# Patient Record
Sex: Male | Born: 1960 | Race: Black or African American | Hispanic: No | Marital: Single | Smoking: Current every day smoker
Health system: Southern US, Community
[De-identification: ages and names within clinical notes are randomized; demographics above are authoritative.]

## PROBLEM LIST (undated history)

## (undated) DIAGNOSIS — I350 Nonrheumatic aortic (valve) stenosis: Secondary | ICD-10-CM

## (undated) DIAGNOSIS — I513 Intracardiac thrombosis, not elsewhere classified: Secondary | ICD-10-CM

## (undated) DIAGNOSIS — I2699 Other pulmonary embolism without acute cor pulmonale: Secondary | ICD-10-CM

## (undated) DIAGNOSIS — M1990 Unspecified osteoarthritis, unspecified site: Secondary | ICD-10-CM

## (undated) DIAGNOSIS — K922 Gastrointestinal hemorrhage, unspecified: Secondary | ICD-10-CM

## (undated) HISTORY — PX: NO PAST SURGERIES: SHX2092

---

## 2009-02-02 ENCOUNTER — Other Ambulatory Visit: Payer: Self-pay

## 2018-01-27 ENCOUNTER — Other Ambulatory Visit: Payer: Self-pay

## 2018-01-27 ENCOUNTER — Inpatient Hospital Stay
Admission: EM | Admit: 2018-01-27 | Discharge: 2018-01-30 | DRG: 378 | Discharge status: Against Medical Advice | Payer: Medicare Other | Attending: Internal Medicine | Admitting: Internal Medicine

## 2018-01-27 ENCOUNTER — Encounter: Payer: Self-pay | Admitting: Emergency Medicine

## 2018-01-27 DIAGNOSIS — D696 Thrombocytopenia, unspecified: Secondary | ICD-10-CM | POA: Diagnosis present

## 2018-01-27 DIAGNOSIS — K921 Melena: Secondary | ICD-10-CM | POA: Diagnosis not present

## 2018-01-27 DIAGNOSIS — K922 Gastrointestinal hemorrhage, unspecified: Secondary | ICD-10-CM | POA: Diagnosis present

## 2018-01-27 DIAGNOSIS — R7301 Impaired fasting glucose: Secondary | ICD-10-CM | POA: Diagnosis present

## 2018-01-27 DIAGNOSIS — K5521 Angiodysplasia of colon with hemorrhage: Secondary | ICD-10-CM | POA: Diagnosis present

## 2018-01-27 DIAGNOSIS — Z79899 Other long term (current) drug therapy: Secondary | ICD-10-CM

## 2018-01-27 DIAGNOSIS — K621 Rectal polyp: Secondary | ICD-10-CM | POA: Diagnosis present

## 2018-01-27 DIAGNOSIS — K254 Chronic or unspecified gastric ulcer with hemorrhage: Secondary | ICD-10-CM | POA: Diagnosis present

## 2018-01-27 DIAGNOSIS — F17210 Nicotine dependence, cigarettes, uncomplicated: Secondary | ICD-10-CM | POA: Diagnosis present

## 2018-01-27 DIAGNOSIS — I350 Nonrheumatic aortic (valve) stenosis: Secondary | ICD-10-CM | POA: Diagnosis present

## 2018-01-27 DIAGNOSIS — I248 Other forms of acute ischemic heart disease: Secondary | ICD-10-CM | POA: Diagnosis present

## 2018-01-27 DIAGNOSIS — K648 Other hemorrhoids: Secondary | ICD-10-CM | POA: Diagnosis present

## 2018-01-27 DIAGNOSIS — D62 Acute posthemorrhagic anemia: Secondary | ICD-10-CM | POA: Diagnosis present

## 2018-01-27 DIAGNOSIS — K5520 Angiodysplasia of colon without hemorrhage: Secondary | ICD-10-CM | POA: Diagnosis not present

## 2018-01-27 DIAGNOSIS — Z7901 Long term (current) use of anticoagulants: Secondary | ICD-10-CM

## 2018-01-27 DIAGNOSIS — R7989 Other specified abnormal findings of blood chemistry: Secondary | ICD-10-CM | POA: Diagnosis present

## 2018-01-27 DIAGNOSIS — Z86711 Personal history of pulmonary embolism: Secondary | ICD-10-CM

## 2018-01-27 DIAGNOSIS — Z86718 Personal history of other venous thrombosis and embolism: Secondary | ICD-10-CM | POA: Diagnosis not present

## 2018-01-27 DIAGNOSIS — D649 Anemia, unspecified: Secondary | ICD-10-CM

## 2018-01-27 HISTORY — DX: Nonrheumatic aortic (valve) stenosis: I35.0

## 2018-01-27 HISTORY — DX: Gastrointestinal hemorrhage, unspecified: K92.2

## 2018-01-27 HISTORY — DX: Unspecified osteoarthritis, unspecified site: M19.90

## 2018-01-27 HISTORY — DX: Intracardiac thrombosis, not elsewhere classified: I51.3

## 2018-01-27 HISTORY — DX: Other pulmonary embolism without acute cor pulmonale: I26.99

## 2018-01-27 LAB — PROTIME-INR
INR: 1.07
PROTHROMBIN TIME: 13.8 s (ref 11.4–15.2)

## 2018-01-27 LAB — COMPREHENSIVE METABOLIC PANEL
ALBUMIN: 2.4 g/dL — AB (ref 3.5–5.0)
ALT: 14 U/L (ref 0–44)
AST: 29 U/L (ref 15–41)
Alkaline Phosphatase: 24 U/L — ABNORMAL LOW (ref 38–126)
Anion gap: 4 — ABNORMAL LOW (ref 5–15)
BUN: 26 mg/dL — ABNORMAL HIGH (ref 6–20)
CO2: 27 mmol/L (ref 22–32)
Calcium: 7.4 mg/dL — ABNORMAL LOW (ref 8.9–10.3)
Chloride: 103 mmol/L (ref 98–111)
Creatinine, Ser: 1.03 mg/dL (ref 0.61–1.24)
GFR calc Af Amer: >60 mL/min (ref >60)
GFR calc non Af Amer: >60 mL/min (ref >60)
GLUCOSE: 125 mg/dL — AB (ref 70–99)
POTASSIUM: 4.2 mmol/L (ref 3.5–5.1)
SODIUM: 134 mmol/L — AB (ref 135–145)
Total Bilirubin: 0.7 mg/dL (ref 0.3–1.2)
Total Protein: 4.5 g/dL — ABNORMAL LOW (ref 6.5–8.1)

## 2018-01-27 LAB — CBC
HEMATOCRIT: 21.5 % — AB (ref 40.0–52.0)
Hemoglobin: 7.5 g/dL — ABNORMAL LOW (ref 13.0–18.0)
MCH: 32.3 pg (ref 26.0–34.0)
MCHC: 34.8 g/dL (ref 32.0–36.0)
MCV: 92.7 fL (ref 80.0–100.0)
Platelets: 135 10*3/uL — ABNORMAL LOW (ref 150–440)
RBC: 2.32 MIL/uL — ABNORMAL LOW (ref 4.40–5.90)
RDW: 16.4 % — ABNORMAL HIGH (ref 11.5–14.5)
WBC: 10.4 10*3/uL (ref 3.8–10.6)

## 2018-01-27 LAB — HEMOGLOBIN: HEMOGLOBIN: 7.4 g/dL — AB (ref 13.0–18.0)

## 2018-01-27 MED ORDER — PEG 3350-KCL-NA BICARB-NACL 420 G PO SOLR
4000.0000 mL | Freq: Once | ORAL | Status: AC
  Administered 2018-01-27: 20:00:00 4000 mL via ORAL
  Filled 2018-01-27: qty 4000

## 2018-01-27 MED ORDER — OXYCODONE HCL 5 MG PO TABS
5.0000 mg | ORAL_TABLET | Freq: Three times a day (TID) | ORAL | Status: DC | PRN
  Administered 2018-01-28 – 2018-01-29 (×3): 5 mg via ORAL
  Filled 2018-01-27 (×3): qty 1

## 2018-01-27 MED ORDER — OXYCODONE HCL 5 MG PO TABS
10.00 | ORAL_TABLET | ORAL | Status: DC
Start: ? — End: 2018-01-27

## 2018-01-27 MED ORDER — ACETAMINOPHEN 650 MG RE SUPP: 650.0000 mg | Freq: Four times a day (QID) | RECTAL | Status: DC | PRN

## 2018-01-27 MED ORDER — LIDOCAINE HCL 1 % IJ SOLN
0.50 | INTRAMUSCULAR | Status: DC
Start: ? — End: 2018-01-27

## 2018-01-27 MED ORDER — PANTOPRAZOLE SODIUM 40 MG PO TBEC
40.00 | DELAYED_RELEASE_TABLET | ORAL | Status: DC
Start: 2018-01-27 — End: 2018-01-27

## 2018-01-27 MED ORDER — ONDANSETRON HCL 4 MG/2ML IJ SOLN: 4.0000 mg | Freq: Four times a day (QID) | INTRAMUSCULAR | Status: DC | PRN

## 2018-01-27 MED ORDER — ONDANSETRON HCL 4 MG PO TABS: 4.0000 mg | ORAL_TABLET | Freq: Four times a day (QID) | ORAL | Status: DC | PRN

## 2018-01-27 MED ORDER — PANTOPRAZOLE SODIUM 40 MG PO TBEC
40.0000 mg | DELAYED_RELEASE_TABLET | Freq: Two times a day (BID) | ORAL | Status: DC
  Administered 2018-01-27 – 2018-01-30 (×4): 40 mg via ORAL
  Filled 2018-01-27 (×4): qty 1

## 2018-01-27 MED ORDER — PEG-3350/ELECTROLYTES 236 G PO SOLR
4000.00 | ORAL | Status: DC
Start: ? — End: 2018-01-27

## 2018-01-27 MED ORDER — ACETAMINOPHEN 325 MG PO TABS: 650.0000 mg | ORAL_TABLET | Freq: Four times a day (QID) | ORAL | Status: DC | PRN

## 2018-01-27 MED ORDER — SODIUM CHLORIDE 0.9 % IV SOLN
400.0000 mg | Freq: Once | INTRAVENOUS | Status: AC
  Administered 2018-01-27: 400 mg via INTRAVENOUS
  Filled 2018-01-27: qty 20

## 2018-01-27 MED ORDER — ONDANSETRON HCL 4 MG/2ML IJ SOLN
4.00 | INTRAMUSCULAR | Status: DC
Start: ? — End: 2018-01-27

## 2018-01-27 MED ORDER — GENERIC EXTERNAL MEDICATION
6.25 | Status: DC
Start: 2018-01-27 — End: 2018-01-27

## 2018-01-27 NOTE — H&P (Addendum)
[facility] [place] at [facility]   PATIENT NAME: James Shepherd    MR#:  [id number]  DATE OF BIRTH:  06-13-1960  DATE OF ADMISSION:  01/27/2018  PRIMARY CARE PHYSICIAN: James Sato, MD   REQUESTING/REFERRING PHYSICIAN: Dr Sharman Cheek  CHIEF COMPLAINT:   Chief Complaint  Patient presents with  . GI Bleeding    HISTORY OF PRESENT ILLNESS:  James Shepherd  is a 57 y.o. male with a known history of signing out AGAINST MEDICAL ADVICE this morning from [facility].  He states that he was there for quite a bit of time with bleeding which started out as melena.  He received 11 units of blood.  They found a small gastric ulcer on endoscopy.  He took the colonoscopy prep on Wednesday and Thursday night and then they ended up canceling the colonoscopy.  He signed out AGAINST MEDICAL ADVICE.  Patient does feel fatigued.  Has some nausea.  No abdominal pain.  Today he he stopped at [employer] and had a bowel movement which showed bright red bowel movement that he had.  He decided to come at [facility] for further evaluation.  The ER physician spoke with Dr. Servando Snare gastroenterology who recommended a colonoscopy prep tonight for procedure tomorrow.  PAST MEDICAL HISTORY:   Past Medical History:  Diagnosis Date  . Aortic stenosis   . Arthritis   . GI bleeding   . PE (pulmonary thromboembolism) (HCC)   . Thrombus of atrial appendage     PAST SURGICAL HISTORY:   Past Surgical History:  Procedure Laterality Date  . NO PAST SURGERIES      SOCIAL HISTORY:   Social History   Tobacco Use  . Smoking status: Current Every Day Smoker  . Smokeless tobacco: Never Used  Substance Use Topics  . Alcohol use: Yes    Comment: 2 beers per day    FAMILY HISTORY:   Family History  Problem Relation Age of Onset  . CAD Mother     DRUG ALLERGIES:  No Known Allergies  REVIEW OF SYSTEMS:  CONSTITUTIONAL: No fever, chills or sweats.  Positive for fatigue and  weakness.  EYES: No blurred or double vision.  EARS, NOSE, AND THROAT: No tinnitus or ear pain. No sore throat RESPIRATORY: No cough, shortness of breath, wheezing or hemoptysis.  CARDIOVASCULAR: No chest pain, orthopnea, edema.  GASTROINTESTINAL: Positive for nausea.no vomiting, diarrhea or abdominal pain.  Positive for blood in bowel movements GENITOURINARY: No dysuria, hematuria.  ENDOCRINE: No polyuria, nocturia,  HEMATOLOGY: No anemia, easy bruising or bleeding SKIN: No rash or lesion. MUSCULOSKELETAL: No joint pain or arthritis.   NEUROLOGIC: No tingling, numbness, weakness.  PSYCHIATRY: No anxiety or depression.   MEDICATIONS AT HOME:   Prior to Admission medications   Medication Sig Start Date End Date Taking? Authorizing Provider  pantoprazole (PROTONIX) 40 MG tablet Take 40 mg by mouth 2 (two) times daily.   Yes [provider]  warfarin (COUMADIN) 10 MG tablet Take 10 mg by mouth daily.   Yes [provider]      VITAL SIGNS:  Blood pressure (!) 125/49, pulse 72, temperature 98.7 F (37.1 C), temperature source Oral, resp. rate 11, height 6' (1.829 Shepherd), weight 77.1 kg, SpO2 98 %.  PHYSICAL EXAMINATION:  GENERAL:  57 y.o.-year-old patient lying in the bed with no acute distress.  EYES: Pupils equal, round, reactive to light and accommodation. No scleral icterus. Extraocular muscles intact.  HEENT: Head atraumatic, normocephalic.  Oropharynx and nasopharynx clear.  NECK:  Supple, no jugular venous distention. No thyroid enlargement, no tenderness.  LUNGS: Normal breath sounds bilaterally, no wheezing, rales,rhonchi or crepitation. No use of accessory muscles of respiration.  CARDIOVASCULAR: S1, S2 normal. No murmurs, rubs, or gallops.  ABDOMEN: Soft, nontender, nondistended. Bowel sounds present. No organomegaly or mass.  EXTREMITIES: 2+ pedal edema, no cyanosis, or clubbing.  NEUROLOGIC: Cranial nerves II through XII are intact. Muscle strength 5/5 in all  extremities. Sensation intact. Gait not checked.  PSYCHIATRIC: The patient is alert and oriented x 3.  SKIN: No rash, lesion, or ulcer.   LABORATORY PANEL:   CBC Recent Labs  Lab 01/27/18 1445  WBC 10.4  HGB 7.5*  HCT 21.5*  PLT 135*   ------------------------------------------------------------------------------------------------------------------  Chemistries  Recent Labs  Lab 01/27/18 1445  NA 134*  K 4.2  CL 103  CO2 27  GLUCOSE 125*  BUN 26*  CREATININE 1.03  CALCIUM 7.4*  AST 29  ALT 14  ALKPHOS 24*  BILITOT 0.7   ------------------------------------------------------------------------------------------------------------------     IMPRESSION AND PLAN:   1.  Gastrointestinal bleeding, acute blood loss anemia with recent endoscopy showing a small gastric ulcer that was nonbleeding.  Serial hemoglobins.  Prep for colonoscopy.  Gastrointestinal consultation.  IV iron to be given. 2.  Severe aortic stenosis seen on prior echocardiogram. 3.  Impaired fasting glucose check a hemoglobin A1c 4.  Tobacco abuse.  Smoking cessation counseling done 4 minutes by me.  No need for nicotine patch because he only smokes a few cigarettes per day. 5.  Drinks a couple beers per day.  He will have to stop this. 6.  Thrombocytopenia check hepatitis C 7.  History of DVT and PE, history of thrombus and atrial appendage.  With major GI bleed, Unlikely that we will be able to put him back on the Coumadin right at this point.  All the records are reviewed and case discussed with ED provider. Management plans discussed with the patient, family and they are in agreement.  CODE STATUS: Full code  TOTAL TIME TAKING CARE OF THIS PATIENT: 50 minutes, including ACP time.    Alford Highlandichard Solenne Manwarren Shepherd.D on 01/27/2018 at 5:00 PM  Between 7am to 6pm - Pager - [phone]  After 6pm call admission pager [phone]  [facility] Office  [phone]  CC: Primary care physician; James SatoMiles, Linda  M, MD

## 2018-01-27 NOTE — ED Provider Notes (Signed)
[facility] Emergency Department Provider Note  ____________________________________________  Time seen: Approximately 4:48 PM  I have reviewed the triage vital signs and the nursing notes.   HISTORY  Chief Complaint GI Bleeding    HPI James Shepherd is a 57 y.o. male with a history of pulmonary embolism who comes to the ED complaining of rectal bleeding.  The patient had been on Coumadin in the past due to his PEs.  He started having rectal bleeding a week ago and [facility] to [facility] where he was found in the ED to have an INR of 6 and a hemoglobin of 5.  He was started on blood transfusions.  Coumadin was withheld, a CT bleeding scan was negative in the ED.  [facility] he was transfused a total of 11 units of blood.  An EGD was overall unremarkable, just showing a small nonbleeding ulcer at that was found to be H. pylori negative on biopsy.  The patient was planned for colonoscopy but after initially refusing to have it done concurrently with the EGD, then having to have it later rescheduled due to concerns about profuse bleeding, the patient eventually left [facility] AGAINST MEDICAL ADVICE this morning.  Later in the morning while at the grocery he had a large bloody bowel movement and has been feeling dizzy so he comes to [facility] not to continue his care.  Dizziness is worse standing better lying down.  No other aggravating or alleviating factors.  Intermittent.  Moderate severity.      Past Medical History:  Diagnosis Date  . Arthritis   . GI bleeding   . PE (pulmonary thromboembolism) (HCC)   . Thrombus of atrial appendage      Patient Active Problem List   Diagnosis Date Noted  . GI bleed 01/27/2018     History reviewed. No pertinent surgical history.   Prior to Admission medications   Medication Sig Start Date End Date Taking? Authorizing Provider  pantoprazole (PROTONIX) 40 MG tablet Take 40 mg by mouth 2 (two) times daily.   Yes [provider]  warfarin (COUMADIN) 10 MG tablet Take 10 mg by mouth daily.   Yes [provider]     Allergies Patient has no known allergies.   History reviewed. No pertinent family history.  Social History Social History   Tobacco Use  . Smoking status: Current Every Day Smoker  . Smokeless tobacco: Never Used  Substance Use Topics  . Alcohol use: Yes    Comment: 2 beers per day  . Drug use: Never    Review of Systems  Constitutional:   No fever or chills.  ENT:   No sore throat. No rhinorrhea. Cardiovascular:   No chest pain or syncope. Respiratory:   No dyspnea or cough. Gastrointestinal:   Negative for abdominal pain, vomiting and diarrhea.  Positive rectal bleeding Musculoskeletal:   Negative for focal pain or swelling All other systems reviewed and are negative except as documented above in ROS and HPI.  ____________________________________________   PHYSICAL EXAM:  VITAL SIGNS: ED Triage Vitals [01/27/18 1435]  Enc Vitals Group     BP 109/61     Pulse Rate (!) 115     Resp 18     Temp 98.7 F (37.1 C)     Temp Source Oral     SpO2 100 %     Weight 170 lb (77.1 kg)     Height 6' (1.829 m)     Head Circumference  Peak Flow      Pain Score 0     Pain Loc      Pain Edu?      Excl. in GC?     Vital signs reviewed, nursing assessments reviewed.   Constitutional:   Alert and oriented. Non-toxic appearance. Eyes:   Conjunctivae are normal. EOMI. PERRL. ENT      Head:   Normocephalic and atraumatic.      Nose:   No congestion/rhinnorhea.       Mouth/Throat:   MMM, no pharyngeal erythema. No peritonsillar mass.       Neck:   No meningismus. Full ROM. Hematological/Lymphatic/Immunilogical:   No cervical lymphadenopathy. Cardiovascular:   RRR heart rate 80. Symmetric bilateral radial and DP pulses.  No murmurs. Cap refill less than 2 seconds. Respiratory:   Normal respiratory effort without tachypnea/retractions. Breath sounds are clear  and equal bilaterally. No wheezes/rales/rhonchi. Gastrointestinal:   Soft and nontender. Non distended. There is no CVA tenderness.  No rebound, rigidity, or guarding.  Rectal exam shows maroon stool, strongly Hemoccult positive  Musculoskeletal:   Normal range of motion in all extremities. No joint effusions.  No lower extremity tenderness.  No edema. Neurologic:   Normal speech and language.  Motor grossly intact. No acute focal neurologic deficits are appreciated.  Skin:    Skin is warm, dry and intact. No rash noted.  No petechiae, purpura, or bullae.  ____________________________________________    LABS (pertinent positives/negatives) (all labs ordered are listed, but only abnormal results are displayed) Labs Reviewed  COMPREHENSIVE METABOLIC PANEL - Abnormal; Notable for the following components:      Result Value   Sodium 134 (*)    Glucose, Bld 125 (*)    BUN 26 (*)    Calcium 7.4 (*)    Total Protein 4.5 (*)    Albumin 2.4 (*)    Alkaline Phosphatase 24 (*)    Anion gap 4 (*)    All other components within normal limits  CBC - Abnormal; Notable for the following components:   RBC 2.32 (*)    Hemoglobin 7.5 (*)    HCT 21.5 (*)    RDW 16.4 (*)    Platelets 135 (*)    All other components within normal limits  PROTIME-INR  POC OCCULT BLOOD, ED  TYPE AND SCREEN   ____________________________________________   EKG    ____________________________________________    RADIOLOGY  No results found.  ____________________________________________   PROCEDURES .Critical Care Performed by: Sharman Cheek, MD Authorized by: Sharman Cheek, MD   Critical care provider statement:    Critical care time (minutes):  35   Critical care time was exclusive of:  Separately billable procedures and treating other patients   Critical care was necessary to treat or prevent imminent or life-threatening deterioration of the following conditions:  Circulatory failure and  shock   Critical care was time spent personally by me on the following activities:  Development of treatment plan with patient or surrogate, discussions with consultants, evaluation of patient's response to treatment, examination of patient, obtaining history from patient or surrogate, ordering and performing treatments and interventions, ordering and review of laboratory studies, ordering and review of radiographic studies, pulse oximetry, re-evaluation of patient's condition and review of old charts    ____________________________________________    CLINICAL IMPRESSION / ASSESSMENT AND PLAN / ED COURSE  Pertinent labs & imaging results that were available during my care of the patient were reviewed by me and considered in my  medical decision making (see chart for details).    Patient presents with acute lower GI bleed and acute blood loss anemia.  Currently hemoglobin is 7.5 which appears to be stable compared to the 6.3 that was obtained this morning at [facility] just prior to receiving 1 unit of blood before he left that hospital AGAINST MEDICAL ADVICE.  Normotensive, he is orthostatic but not tachycardic at rest in the treatment bed.  INR has normalized.  At this time will defer further transfusions unless he has another bloody bowel movement or deteriorating vital signs.  Case discussed with Dr. Servando Snare who recommends GI prep today for colonoscopy tomorrow by Dr. Tobi Bastos.  Discussed with hospitalist for further management.      ____________________________________________   FINAL CLINICAL IMPRESSION(S) / ED DIAGNOSES    Final diagnoses:  Acute lower GI bleeding  Acute anemia     ED Discharge Orders    None      Portions of this note were generated with dragon dictation software. Dictation errors may occur despite best attempts at proofreading.    Sharman Cheek, MD 01/27/18 [phone]

## 2018-01-27 NOTE — ED Triage Notes (Signed)
Dark red blood from rectum X 1 week. Left [facility] because he was prepped X 2 for coloscopy and they did not get to him.  Sent [facility] for same sx.  No LOC.  Pt reports he had an endoscopy and just needs the colonoscopy and that he is already cleaned out if they can do it today.  Alert and oriented. ST noted in triage but VSS. Pt is on coumadin; has not taken since last Friday.

## 2018-01-27 NOTE — Progress Notes (Signed)
Patient ID: James RicksGary Shepherd, male   DOB: 03/26/1961, 57 y.o.   MRN: [id number]  ACP note Patient present  Diagnosis: Acute GI bleed, acute blood loss anemia, severe aortic stenosis, history of PE and blood clot and atrial appendage, tobacco abuse  CODE STATUS discussed.  Patient is a full code.  Plan.  GI bleed.  Oral Protonix.  Serial hemoglobins.  GI consultation for colonoscopy.  May end up needing capsule endoscopy at some point. IV iron ordered.  Time spent on ACP discussion 17 minutes Dr. Alford Highlandichard Keyandra Swenson

## 2018-01-27 NOTE — ED Notes (Signed)
Wieting at bedside. 

## 2018-01-27 NOTE — ED Notes (Addendum)
Stafford MD at bedside - POC stool occult performed. Positive per MD

## 2018-01-27 NOTE — ED Triage Notes (Signed)
First Nurse Note:  Arrives from [facility] for evaluation of GI bleed.  Patient was [facility] and was scheduled for colonoscopy today, but patient signed out AMA prior to procedure.  Presented at [facility] today due to continued GI bleeding.  PT on Friday was 6.1.  Patient is AAOx3.  Skin warm and dry. NAD

## 2018-01-28 ENCOUNTER — Inpatient Hospital Stay: Payer: Medicare Other | Admitting: Anesthesiology

## 2018-01-28 ENCOUNTER — Encounter: Payer: Self-pay | Admitting: Anesthesiology

## 2018-01-28 ENCOUNTER — Encounter
Admission: EM | Discharge status: Against Medical Advice | Payer: Self-pay | Source: Home / Self Care | Attending: Internal Medicine

## 2018-01-28 DIAGNOSIS — K621 Rectal polyp: Secondary | ICD-10-CM

## 2018-01-28 DIAGNOSIS — K921 Melena: Secondary | ICD-10-CM

## 2018-01-28 DIAGNOSIS — K922 Gastrointestinal hemorrhage, unspecified: Secondary | ICD-10-CM

## 2018-01-28 DIAGNOSIS — K5520 Angiodysplasia of colon without hemorrhage: Secondary | ICD-10-CM

## 2018-01-28 HISTORY — PX: COLONOSCOPY WITH PROPOFOL: SHX5780

## 2018-01-28 LAB — CBC
HCT: 16.7 % — ABNORMAL LOW (ref 40.0–52.0)
Hemoglobin: 5.8 g/dL — ABNORMAL LOW (ref 13.0–18.0)
MCH: 32.8 pg (ref 26.0–34.0)
MCHC: 35 g/dL (ref 32.0–36.0)
MCV: 93.6 fL (ref 80.0–100.0)
PLATELETS: 107 10*3/uL — AB (ref 150–440)
RBC: 1.78 MIL/uL — ABNORMAL LOW (ref 4.40–5.90)
RDW: 18.8 % — AB (ref 11.5–14.5)
WBC: 8.9 10*3/uL (ref 3.8–10.6)

## 2018-01-28 LAB — HEMOGLOBIN AND HEMATOCRIT, BLOOD
HCT: 24 % — ABNORMAL LOW (ref 40.0–52.0)
HEMATOCRIT: 22.8 % — AB (ref 40.0–52.0)
Hemoglobin: 8.3 g/dL — ABNORMAL LOW (ref 13.0–18.0)
Hemoglobin: 7.9 g/dL — ABNORMAL LOW (ref 13.0–18.0)

## 2018-01-28 LAB — BASIC METABOLIC PANEL
Anion gap: 4 — ABNORMAL LOW (ref 5–15)
BUN: 20 mg/dL (ref 6–20)
CALCIUM: 7.1 mg/dL — AB (ref 8.9–10.3)
CO2: 28 mmol/L (ref 22–32)
Chloride: 105 mmol/L (ref 98–111)
Creatinine, Ser: 1 mg/dL (ref 0.61–1.24)
GFR calc Af Amer: >60 mL/min (ref >60)
GLUCOSE: 101 mg/dL — AB (ref 70–99)
Potassium: 4.3 mmol/L (ref 3.5–5.1)
Sodium: 137 mmol/L (ref 135–145)

## 2018-01-28 LAB — IRON AND TIBC
Iron: 158 ug/dL (ref 45–182)
Saturation Ratios: 61 % — ABNORMAL HIGH (ref 17.9–39.5)
TIBC: 257 ug/dL (ref 250–450)
UIBC: 99 ug/dL

## 2018-01-28 LAB — PREPARE RBC (CROSSMATCH)

## 2018-01-28 LAB — TROPONIN I
TROPONIN I: 0.06 ng/mL — AB (ref <0.03)
TROPONIN I: 0.06 ng/mL — AB (ref <0.03)
Troponin I: 0.06 ng/mL (ref <0.03)

## 2018-01-28 LAB — LACTATE DEHYDROGENASE: LDH: 110 U/L (ref 98–192)

## 2018-01-28 LAB — HEMOGLOBIN A1C
HEMOGLOBIN A1C: 5.1 % (ref 4.8–5.6)
MEAN PLASMA GLUCOSE: 99.67 mg/dL

## 2018-01-28 LAB — TRANSFERRIN: TRANSFERRIN: 177 mg/dL — AB (ref 180–329)

## 2018-01-28 LAB — ABO/RH: ABO/RH(D): AB NEG

## 2018-01-28 LAB — VITAMIN B12: VITAMIN B 12: 387 pg/mL (ref 180–914)

## 2018-01-28 LAB — FOLATE: Folate: 6.2 ng/mL (ref >5.9)

## 2018-01-28 SURGERY — COLONOSCOPY WITH PROPOFOL
Anesthesia: General

## 2018-01-28 MED ORDER — PROPOFOL 500 MG/50ML IV EMUL
INTRAVENOUS | Status: AC
  Filled 2018-01-28: qty 50

## 2018-01-28 MED ORDER — LIDOCAINE HCL (PF) 2 % IJ SOLN
INTRAMUSCULAR | Status: AC
  Filled 2018-01-28: qty 10

## 2018-01-28 MED ORDER — SODIUM CHLORIDE 0.9% IV SOLUTION
Freq: Once | INTRAVENOUS | Status: AC
  Administered 2018-01-28: 08:00:00 via INTRAVENOUS

## 2018-01-28 MED ORDER — PROPOFOL 10 MG/ML IV BOLUS
INTRAVENOUS | Status: AC
  Filled 2018-01-28: qty 20

## 2018-01-28 MED ORDER — EPHEDRINE SULFATE 50 MG/ML IJ SOLN
INTRAMUSCULAR | Status: DC | PRN
  Administered 2018-01-28: 5 mg via INTRAVENOUS
  Administered 2018-01-28: 15 mg via INTRAVENOUS
  Administered 2018-01-28: 10 mg via INTRAVENOUS
  Administered 2018-01-28: 5 mg via INTRAVENOUS

## 2018-01-28 MED ORDER — PHENYLEPHRINE HCL 10 MG/ML IJ SOLN
INTRAMUSCULAR | Status: DC | PRN
  Administered 2018-01-28: 100 ug via INTRAVENOUS
  Administered 2018-01-28: 200 ug via INTRAVENOUS
  Administered 2018-01-28: 100 ug via INTRAVENOUS
  Administered 2018-01-28: 200 ug via INTRAVENOUS
  Administered 2018-01-28: 100 ug via INTRAVENOUS
  Administered 2018-01-28: 150 ug via INTRAVENOUS
  Administered 2018-01-28: 200 ug via INTRAVENOUS

## 2018-01-28 MED ORDER — SODIUM CHLORIDE 0.9% IV SOLUTION: Freq: Once | INTRAVENOUS | Status: AC

## 2018-01-28 MED ORDER — PROPOFOL 10 MG/ML IV BOLUS
INTRAVENOUS | Status: DC | PRN
  Administered 2018-01-28: 70 mg via INTRAVENOUS

## 2018-01-28 MED ORDER — SODIUM CHLORIDE 0.9 % IV SOLN
INTRAVENOUS | Status: DC
  Administered 2018-01-28: 1000 mL via INTRAVENOUS

## 2018-01-28 MED ORDER — LIDOCAINE HCL (CARDIAC) PF 100 MG/5ML IV SOSY
PREFILLED_SYRINGE | INTRAVENOUS | Status: DC | PRN
  Administered 2018-01-28: 50 mg via INTRAVENOUS

## 2018-01-28 MED ORDER — PROPOFOL 500 MG/50ML IV EMUL
INTRAVENOUS | Status: DC | PRN
  Administered 2018-01-28: 175 ug/kg/min via INTRAVENOUS

## 2018-01-28 NOTE — Progress Notes (Signed)
Lab notified this nurse of troponin 0.06, notified Dr. Amado CoeGouru. No new orders received.

## 2018-01-28 NOTE — H&P (Signed)
Wyline Mood, MD [address], [address], [place], [place], [address] [address], [address], [place], [place], [address] Phone: [phone]  Fax: [phone]  Primary Care Physician:  Leanna Sato, MD   Pre-Procedure History & Physical: HPI:  James Shepherd is a 57 y.o. male is here for an colonoscopy.   Past Medical History:  Diagnosis Date  . Aortic stenosis   . Arthritis   . GI bleeding   . PE (pulmonary thromboembolism) (HCC)   . Thrombus of atrial appendage     Past Surgical History:  Procedure Laterality Date  . NO PAST SURGERIES      Prior to Admission medications   Medication Sig Start Date End Date Taking? Authorizing Provider  pantoprazole (PROTONIX) 40 MG tablet Take 40 mg by mouth 2 (two) times daily.   Yes [provider]  warfarin (COUMADIN) 10 MG tablet Take 10 mg by mouth daily.   Yes [provider]    Allergies as of 01/27/2018  . (No Known Allergies)    Family History  Problem Relation Age of Onset  . CAD Mother     Social History   Socioeconomic History  . Marital status: Single    Spouse name: Not on file  . Number of children: Not on file  . Years of education: Not on file  . Highest education level: Not on file  Occupational History  . Not on file  Social Needs  . Financial resource strain: Not on file  . Food insecurity:    Worry: Not on file    Inability: Not on file  . Transportation needs:    Medical: Not on file    Non-medical: Not on file  Tobacco Use  . Smoking status: Current Every Day Smoker  . Smokeless tobacco: Never Used  Substance and Sexual Activity  . Alcohol use: Yes    Comment: 2 beers per day  . Drug use: Never  . Sexual activity: Not on file  Lifestyle  . Physical activity:    Days per week: Not on file    Minutes per session: Not on file  . Stress: Not on file  Relationships  . Social connections:    Talks on phone: Not on file    Gets together: Not on file   Attends religious service: Not on file    Active member of club or organization: Not on file    Attends meetings of clubs or organizations: Not on file    Relationship status: Not on file  . Intimate partner violence:    Fear of current or ex partner: Not on file    Emotionally abused: Not on file    Physically abused: Not on file    Forced sexual activity: Not on file  Other Topics Concern  . Not on file  Social History Narrative  . Not on file    Review of Systems: See HPI, otherwise negative ROS  Physical Exam: BP (!) 121/47   Pulse (!) 58   Temp 98.8 F (37.1 C) (Oral)   Resp 16   Ht 6' (1.829 m)   Wt 77.1 kg   SpO2 99%   BMI 23.06 kg/m  General:   Alert,  pleasant and cooperative in NAD Head:  Normocephalic and atraumatic. Neck:  Supple; no masses or thyromegaly. Lungs:  Clear throughout to auscultation, normal respiratory effort.    Heart:  +S1, +S2, Regular rate and rhythm, No edema. Abdomen:  Soft, nontender and nondistended. Normal bowel sounds, without guarding, and without rebound.   Neurologic:  Alert and  oriented x4;  grossly normal neurologically.  Impression/Plan: James Shepherd is here for an colonoscopy to be performed for GI bleeding   Risks, benefits, limitations, and alternatives regarding  colonoscopy have been reviewed with the patient.  Questions have been answered.  All parties agreeable.   Wyline MoodKiran Durwin Davisson, MD  01/28/2018, 2:36 PM

## 2018-01-28 NOTE — Progress Notes (Signed)
Pt has undergone extensive outpatient cardiac workup including cardiac ct angiogram with ct ffr. This revaled  What was felt to show no hemodynamically significant coronary artery disease. Would proceed with gi workup with no further cardiac workup. He appears to be at low risk for egd/colonoscopy. Elevated troponin appears to be demand due to profound anemia.

## 2018-01-28 NOTE — Progress Notes (Signed)
Notified Dr. Tobi BastosAnna of questionable NPO status and when the patient can eat. Dr. Tobi BastosAnna stated the patient can have clear liquids, but no food post 5 hours after capsule study

## 2018-01-28 NOTE — Anesthesia Postprocedure Evaluation (Signed)
Anesthesia Post Note  Patient: Harvel RicksGary Kiser  Procedure(s) Performed: COLONOSCOPY WITH PROPOFOL (N/A )  Patient location during evaluation: Endoscopy Anesthesia Type: General Level of consciousness: awake and alert Pain management: pain level controlled Vital Signs Assessment: post-procedure vital signs reviewed and stable Respiratory status: spontaneous breathing, nonlabored ventilation, respiratory function stable and patient connected to nasal cannula oxygen Cardiovascular status: blood pressure returned to baseline and stable Postop Assessment: no apparent nausea or vomiting Anesthetic complications: no     Last Vitals:  Vitals:   01/28/18 1528 01/28/18 1537  BP: (!) 134/52 (!) 124/52  Pulse: 80 72  Resp: 16 16  Temp:    SpO2: 100% 100%    Last Pain:  Vitals:   01/28/18 1537  TempSrc:   PainSc: 0-No pain                 Cleda MccreedyJoseph K Piscitello

## 2018-01-28 NOTE — Anesthesia Post-op Follow-up Note (Signed)
Anesthesia QCDR form completed.        

## 2018-01-28 NOTE — Op Note (Signed)
[facility] Gastroenterology Patient Name: James Shepherd Procedure Date: 01/28/2018 2:42 PM MRN: [id number] Account #: [id number] Date of Birth: [birth year] Admit Type: Inpatient Age: 57 Room: [facility] ENDO ROOM 4 Gender: Male Note Status: Finalized Procedure:            Colonoscopy Indications:          Hematochezia Providers:            Wyline Mood MD, MD Referring MD:         Leanna Sato, MD (Referring MD) Medicines:            Monitored Anesthesia Care Complications:        No immediate complications. Procedure:            Pre-Anesthesia Assessment:                       - Prior to the procedure, a History and Physical was                        performed, and patient medications, allergies and                        sensitivities were reviewed. The patient's tolerance of                        previous anesthesia was reviewed.                       - The risks and benefits of the procedure and the                        sedation options and risks were discussed with the                        patient. All questions were answered and informed                        consent was obtained.                       - ASA Grade Assessment: III - A patient with severe                        systemic disease.                       After obtaining informed consent, the colonoscope was                        passed under direct vision. Throughout the procedure,                        the patient's blood pressure, pulse, and oxygen                        saturations were monitored continuously. The                        Colonoscope was introduced through the anus and                        advanced to  the the terminal ileum. The colonoscopy was                        performed with ease. The patient tolerated the                        procedure well. The quality of the bowel preparation                        was fair. Findings:      The perianal and digital rectal  examinations were normal.      A 20 mm polyp was found in the rectum. The polyp was pedunculated. The       polyp was removed with a hot snare. Resection and retrieval were       complete. To prevent bleeding after the polypectomy, one hemostatic clip       was successfully placed. There was no bleeding during, or at the end, of       the procedure.      Many sessile, non-bleeding polyps were found in the entire colon. The       polyps were 5 to 8 mm in size. Polypectomy was not attempted due to       presentation of GI bleed and poor prep      Two small localized angioectasias without bleeding were found at the       ileocecal valve. Coagulation for hemostasis using argon plasma at 0.5       liters/minute and 20 watts was successful.      The terminal ileum appeared normal.      Non-bleeding internal hemorrhoids were found during retroflexion. The       hemorrhoids were large. Impression:           - Preparation of the colon was fair.                       - One 20 mm polyp in the rectum, removed with a hot                        snare. Resected and retrieved. Clip was placed.                       - Many 5 to 8 mm, non-bleeding polyps in the entire                        colon. Resection not attempted.                       - Two non-bleeding colonic angioectasias. Treated with                        argon plasma coagulation (APC).                       - The examined portion of the ileum was normal.                       - Non-bleeding internal hemorrhoids. Recommendation:       - Await pathology results.                       -  1. keep NPO for capsule study today                       2. Repeat colonoscopy as an outpatient due to poor prep                        and presence of multiple polyps                       3. If capsule study is negative then will need Meckels                        scan Procedure Code(s):    --- Professional ---                       [phone], 59,  Colonoscopy, flexible; with control of                        bleeding, any method                       45385, Colonoscopy, flexible; with removal of tumor(s),                        polyp(s), or other lesion(s) by snare technique Diagnosis Code(s):    --- Professional ---                       K64.8, Other hemorrhoids                       K55.20, Angiodysplasia of colon without hemorrhage                       K62.1, Rectal polyp                       K92.1, Melena (includes Hematochezia) CPT copyright 2017 American Medical Association. All rights reserved. The codes documented in this report are preliminary and upon coder review may  be revised to meet current compliance requirements. Wyline Mood, MD Wyline Mood MD, MD 01/28/2018 3:16:37 PM This report has been signed electronically. Number of Addenda: 0 Note Initiated On: 01/28/2018 2:42 PM Scope Withdrawal Time: 0 hours 22 minutes 7 seconds  Total Procedure Duration: 0 hours 24 minutes 16 seconds       [facility]

## 2018-01-28 NOTE — Anesthesia Preprocedure Evaluation (Signed)
Anesthesia Evaluation  Patient identified by MRN, date of birth, ID band Patient awake    Reviewed: Allergy & Precautions, H&P , NPO status , Patient's Chart, lab work & pertinent test results  History of Anesthesia Complications Negative for: history of anesthetic complications  Airway Mallampati: III  TM Distance: >3 FB Neck ROM: limited    Dental  (+) Upper Dentures, Lower Dentures, Poor Dentition   Pulmonary shortness of breath and with exertion, COPD, Current Smoker,           Cardiovascular Exercise Tolerance: Poor (-) Past MI + Valvular Problems/Murmurs AS      Neuro/Psych negative neurological ROS  negative psych ROS   GI/Hepatic negative GI ROS, Neg liver ROS, neg GERD  ,  Endo/Other  negative endocrine ROS  Renal/GU negative Renal ROS  negative genitourinary   Musculoskeletal  (+) Arthritis ,   Abdominal   Peds  Hematology negative hematology ROS (+)   Anesthesia Other Findings Concern for lower gi bleed  Past Medical History: No date: Aortic stenosis No date: Arthritis No date: GI bleeding No date: PE (pulmonary thromboembolism) (HCC) No date: Thrombus of atrial appendage  Past Surgical History: No date: NO PAST SURGERIES  BMI    Body Mass Index:  23.06 kg/m      Reproductive/Obstetrics negative OB ROS                             Anesthesia Physical Anesthesia Plan  ASA: IV  Anesthesia Plan: General   Post-op Pain Management:    Induction: Intravenous  PONV Risk Score and Plan: Propofol infusion and TIVA  Airway Management Planned: Natural Airway and Nasal Cannula  Additional Equipment:   Intra-op Plan:   Post-operative Plan:   Informed Consent: I have reviewed the patients History and Physical, chart, labs and discussed the procedure including the risks, benefits and alternatives for the proposed anesthesia with the patient or authorized  representative who has indicated his/her understanding and acceptance.   Dental Advisory Given  Plan Discussed with: Anesthesiologist, CRNA and Surgeon  Anesthesia Plan Comments: (Patient consented for risks of anesthesia including but not limited to:  - adverse reactions to medications - risk of intubation if required - damage to teeth, lips or other oral mucosa - sore throat or hoarseness - Damage to heart, brain, lungs or loss of life  Patient voiced understanding.)        Anesthesia Quick Evaluation

## 2018-01-28 NOTE — Transfer of Care (Signed)
Immediate Anesthesia Transfer of Care Note  Patient: James Shepherd  Procedure(s) Performed: COLONOSCOPY WITH PROPOFOL (N/A )  Patient Location: PACU and Endoscopy Unit  Anesthesia Type:MAC  Level of Consciousness: sedated  Airway & Oxygen Therapy: Patient Spontanous Breathing and Patient connected to nasal cannula oxygen  Post-op Assessment: Report given to RN and Post -op Vital signs reviewed and stable  Post vital signs: Reviewed and stable  Last Vitals:  Vitals Value Taken Time  BP    Temp    Pulse 55 01/28/2018  3:17 PM  Resp 16 01/28/2018  3:17 PM  SpO2 100 % 01/28/2018  3:17 PM    Last Pain:  Vitals:   01/28/18 1205  TempSrc: Oral  PainSc:          Complications: No apparent anesthesia complications

## 2018-01-28 NOTE — Progress Notes (Signed)
[facility] [place] [facility]   PATIENT NAME: James RicksGary Martinson    MR#:  [id number]  DATE OF BIRTH:  November 30, 1960  SUBJECTIVE:  CHIEF COMPLAINT: Patient is resting comfortably.  Denies any chest pain or abdominal pain.  Denies any active bleeding.  Hemoglobin dropped down from 7.5-5.8.  Receiving first unit of blood.  Troponin  0.06.  Had bowel prep for colonoscopy today and very hungry being n.p.o.  REVIEW OF SYSTEMS:  CONSTITUTIONAL: No fever, fatigue or weakness.  EYES: No blurred or double vision.  EARS, NOSE, AND THROAT: No tinnitus or ear pain.  RESPIRATORY: No cough, shortness of breath, wheezing or hemoptysis.  CARDIOVASCULAR: No chest pain, orthopnea, edema.  GASTROINTESTINAL: No nausea, vomiting, diarrhea or abdominal pain.  GENITOURINARY: No dysuria, hematuria.  ENDOCRINE: No polyuria, nocturia,  HEMATOLOGY: No anemia, easy bruising or bleeding SKIN: No rash or lesion. MUSCULOSKELETAL: No joint pain or arthritis.   NEUROLOGIC: No tingling, numbness, weakness.  PSYCHIATRY: No anxiety or depression.   DRUG ALLERGIES:  No Known Allergies  VITALS:  Blood pressure (!) 122/49, pulse 60, temperature 98.4 F (36.9 C), temperature source Oral, resp. rate 16, height 6' (1.829 m), weight 77.1 kg, SpO2 100 %.  PHYSICAL EXAMINATION:  GENERAL:  57 y.o.-year-old patient lying in the bed with no acute distress.  EYES: Pupils equal, round, reactive to light and accommodation. No scleral icterus. Extraocular muscles intact.  HEENT: Head atraumatic, normocephalic. Oropharynx and nasopharynx clear.  NECK:  Supple, no jugular venous distention. No thyroid enlargement, no tenderness.  LUNGS: Normal breath sounds bilaterally, no wheezing, rales,rhonchi or crepitation. No use of accessory muscles of respiration.  CARDIOVASCULAR: S1, S2 normal. No murmurs, rubs, or gallops.  ABDOMEN: Soft, nontender, nondistended. Bowel sounds present.   EXTREMITIES: No pedal edema,  cyanosis, or clubbing.  NEUROLOGIC: Cranial nerves II through XII are intact. Sensation intact. Gait not checked.  PSYCHIATRIC: The patient is alert and oriented x 3.  SKIN: No obvious rash, lesion, or ulcer.    LABORATORY PANEL:   CBC Recent Labs  Lab 01/28/18 0436  WBC 8.9  HGB 5.8*  HCT 16.7*  PLT 107*   ------------------------------------------------------------------------------------------------------------------  Chemistries  Recent Labs  Lab 01/27/18 1445 01/28/18 0436  NA 134* 137  K 4.2 4.3  CL 103 105  CO2 27 28  GLUCOSE 125* 101*  BUN 26* 20  CREATININE 1.03 1.00  CALCIUM 7.4* 7.1*  AST 29  --   ALT 14  --   ALKPHOS 24*  --   BILITOT 0.7  --    ------------------------------------------------------------------------------------------------------------------  Cardiac Enzymes Recent Labs  Lab 01/28/18 0436  TROPONINI 0.06*   ------------------------------------------------------------------------------------------------------------------  RADIOLOGY:  No results found.  EKG:   Orders placed or performed during the hospital encounter of 01/27/18  . EKG 12-Lead  . EKG 12-Lead    ASSESSMENT AND PLAN:   #.  Gastrointestinal bleeding, acute blood loss anemia with recent endoscopy showing a small gastric ulcer that was nonbleeding.   Serial hemoglobins 7.5-7.4-5.8 receiving blood transfusion  Prep for colonoscopy done, awaiting for colonoscopy today  Gastrointestinal consultation discussed with Dr. Tobi BastosAnna.   Rescheduled colonoscopy for today afternoon     We will check iron studies.  Patient is normocytic  #Elevated troponin but patient is a symptomatic denies any chest pain or shortness of breath Troponin at 0.06 probably from demand ischemia Consult placed and discussed with Dr. Lady GaryFath regarding cardiac clearance for the procedure, he is aware of the consult  # .  Severe aortic stenosis seen on prior echocardiogram. Patient seen by Dr. Gwen Pounds  in the past  #   Impaired fasting glucose   hemoglobin A1c 5.1  #   Tobacco abuse.  Smoking cessation counseling done 4 minutes by me.  No need for nicotine patch because he only smokes a few cigarettes per day.  # .  Drinks a couple beers per day.  He will have to stop this.  Counseled patient to stop drinking.  Outpatient alcohol Anonymous  # .  Thrombocytopenia check hepatitis C Platelet count at 107  #.  History of DVT and PE, history of thrombus and atrial appendage.  With major GI bleed, Unlikely that we will be able to put him back on the Coumadin right at this point.  Continue close monitoring     All the records are reviewed and case discussed with Care Management/Social Workerr. Management plans discussed with the patient, family and they are in agreement.  CODE STATUS: FC   TOTAL TIME TAKING CARE OF THIS PATIENT: 36  minutes.   POSSIBLE D/C IN 1-2  DAYS, DEPENDING ON CLINICAL CONDITION.  Note: This dictation was prepared with Dragon dictation along with smaller phrase technology. Any transcriptional errors that result from this process are unintentional.   [name] M.D on 01/28/2018 at 11:54 AM  Between 7am to 6pm - Pager - [phone] After 6pm go to [email or web address] - password [email or web address] [facility]  [place]  [facility]  Office  [phone]  CC: Primary care physician; Leanna Sato, MD

## 2018-01-28 NOTE — Consult Note (Addendum)
Wyline Mood , MD [address], [address], [place], [place], [address] [address], [place], [place], [address] Phone: [phone]  Fax: [phone]  Consultation  Referring Provider:     No ref. provider found Primary Care Physician:  Leanna Sato, MD Primary Gastroenterologist:  [facility]          Reason for Consultation:     GI bleed   Date of Admission:  01/27/2018 Date of Consultation:  01/28/2018         HPI:   Nicholi Ghuman is a 57 y.o. male is a patient was admitted to [facility] on 01/22/2018 with a GI bleed.  Based on their admission note it appears that he has a history of pulmonary emboli, cardiac thrombus on Coumadin and was brought in by EMS to [facility] 3 to 4 days of dark stools dizziness and chest pain.  On 816 he had an INR of 6.1.  He had became lightheaded and had a fall and was brought to the emergency room.  On admission at [facility] he had a hemoglobin of 5.3 g INR of 4.7 and abnormal troponins.  During the admission he had ST elevations associated with chest pain.  He was treated with a PPI, CT abdomen angiogram in the ER did not identify site of bleeding.  He was transfused a total of 11 units of PRBCs over 4 days before leaving AGAINST MEDICAL ADVICE.  He underwent an EGD and was found to have only a small nonbleeding gastric ulcer and some erythema in the duodenal with no active bleeding.  Plan was for a further colonoscopy and possible capsule study of the small bowel.  In fact refused to take the colon prep.  He had continuous drop in hemoglobin and refused blood transfusion the night before discharge, on the morning of his discharge his hemoglobin was 6.3 g was recommended 2 units of transfusion but on repeat agreed to have 1 unit and left.  He presented to the hospital yesterday as he had a bright red bowel movement after he left tube.  Per the admission note he has severe aortic stenosis.  On admission he was discussed by the admitting physician with Dr. Servando Snare and plan was  to perform a colonoscopy this morning.  I noticed this morning that he had a hemoglobin of 5.8 g and I called the floor to inquire if he had a blood transfusion.  Unfortunately they were not aware of the low hemoglobin and hence blood transfusion was subsequently ordered.  On admission his INR was 1.07.   Since coming to the hospital; no further bleeding. No other complaints   Past Medical History:  Diagnosis Date  . Aortic stenosis   . Arthritis   . GI bleeding   . PE (pulmonary thromboembolism) (HCC)   . Thrombus of atrial appendage     Past Surgical History:  Procedure Laterality Date  . NO PAST SURGERIES      Prior to Admission medications   Medication Sig Start Date End Date Taking? Authorizing Provider  pantoprazole (PROTONIX) 40 MG tablet Take 40 mg by mouth 2 (two) times daily.   Yes [provider]  warfarin (COUMADIN) 10 MG tablet Take 10 mg by mouth daily.   Yes [provider]    Family History  Problem Relation Age of Onset  . CAD Mother      Social History   Tobacco Use  . Smoking status: Current Every Day Smoker  . Smokeless  tobacco: Never Used  Substance Use Topics  . Alcohol use: Yes    Comment: 2 beers per day  . Drug use: Never    Allergies as of 01/27/2018  . (No Known Allergies)    Review of Systems:    All systems reviewed and negative except where noted in HPI.   Physical Exam:  Vital signs in last 24 hours: Temp:  [98.4 F (36.9 C)-99 F (37.2 C)] 98.4 F (36.9 C) (08/31 0824) Pulse Rate:  [59-115] 60 (08/31 0824) Resp:  [11-20] 16 (08/31 0824) BP: (108-140)/(39-69) 122/49 (08/31 0824) SpO2:  [98 %-100 %] 100 % (08/31 0824) Weight:  [77.1 kg] 77.1 kg (08/30 1435) Last BM Date: 01/27/18 General:   Pleasant, cooperative in NAD Head:  Normocephalic and atraumatic. Eyes:   No icterus.   Conjunctiva pink. PERRLA. Ears:  Normal auditory acuity. Neck:  Supple; no masses or thyroidomegaly Lungs: Respirations even and  unlabored. Lungs clear to auscultation bilaterally.   No wheezes, crackles, or rhonchi.  Heart:  Regular rate and rhythm;  Systolic murmur in aortic area, no  clicks, rubs or gallops Abdomen:  Soft, nondistended, nontender. Normal bowel sounds. No appreciable masses or hepatomegaly.  No rebound or guarding.  Neurologic:  Alert and oriented x3;  grossly normal neurologically. Skin:  Intact without significant lesions or rashes. Cervical Nodes:  No significant cervical adenopathy. Psych:  Alert and cooperative. Normal affect.  LAB RESULTS: Recent Labs    01/27/18 1445 01/27/18 1840 01/28/18 0436  WBC 10.4  --  8.9  HGB 7.5* 7.4* 5.8*  HCT 21.5*  --  16.7*  PLT 135*  --  107*   BMET Recent Labs    01/27/18 1445 01/28/18 0436  NA 134* 137  K 4.2 4.3  CL 103 105  CO2 27 28  GLUCOSE 125* 101*  BUN 26* 20  CREATININE 1.03 1.00  CALCIUM 7.4* 7.1*   LFT Recent Labs    01/27/18 1445  PROT 4.5*  ALBUMIN 2.4*  AST 29  ALT 14  ALKPHOS 24*  BILITOT 0.7   PT/INR Recent Labs    01/27/18 1445  LABPROT 13.8  INR 1.07    STUDIES: No results found.    Impression / Plan:   Harvel RicksGary Canal is a 57 y.o. y/o male with a severe GI bleed who [facility] , had 11 units transfusion, EGD showed non bleeding ulcer, plan was for colonoscopy, he left AMA, h/o severe aortic stenosis. Amitted with further rectal bleeding and willing to proceed with colonoscopy . Underwent prep last night . Troponins elevated, cleared by cardiology, [facility], no active bleeding presently   I have discussed alternative options, risks & benefits,  which include, but are not limited to, bleeding, infection, perforation,respiratory complication & drug reaction.  The patient agrees with this plan & written consent will be obtained.      Thank you for involving me in the care of this patient.      LOS: 1 day   Wyline MoodKiran Tavoris Brisk, MD  01/28/2018, 8:52 AM

## 2018-01-28 NOTE — Plan of Care (Signed)

## 2018-01-28 NOTE — Anesthesia Procedure Notes (Signed)
Date/Time: 01/28/2018 2:33 PM Performed by: Ginger CarneMichelet, Annabeth Tortora, CRNA Pre-anesthesia Checklist: Patient identified, Emergency Drugs available, Suction available, Patient being monitored and Timeout performed Patient Re-evaluated:Patient Re-evaluated prior to induction Oxygen Delivery Method: Nasal cannula Preoxygenation: Pre-oxygenation with 100% oxygen

## 2018-01-29 DIAGNOSIS — K922 Gastrointestinal hemorrhage, unspecified: Secondary | ICD-10-CM

## 2018-01-29 LAB — TROPONIN I: Troponin I: 0.06 ng/mL (ref <0.03)

## 2018-01-29 LAB — BASIC METABOLIC PANEL
Anion gap: 5 (ref 5–15)
BUN: 14 mg/dL (ref 6–20)
CALCIUM: 7.6 mg/dL — AB (ref 8.9–10.3)
CHLORIDE: 106 mmol/L (ref 98–111)
CO2: 26 mmol/L (ref 22–32)
CREATININE: 1.03 mg/dL (ref 0.61–1.24)
GFR calc non Af Amer: >60 mL/min (ref >60)
GLUCOSE: 112 mg/dL — AB (ref 70–99)
Potassium: 4.2 mmol/L (ref 3.5–5.1)
Sodium: 137 mmol/L (ref 135–145)

## 2018-01-29 LAB — CBC
HCT: 21.4 % — ABNORMAL LOW (ref 40.0–52.0)
HCT: 22.6 % — ABNORMAL LOW (ref 40.0–52.0)
Hemoglobin: 7.4 g/dL — ABNORMAL LOW (ref 13.0–18.0)
Hemoglobin: 7.6 g/dL — ABNORMAL LOW (ref 13.0–18.0)
MCH: 31.6 pg (ref 26.0–34.0)
MCH: 31.9 pg (ref 26.0–34.0)
MCHC: 33.7 g/dL (ref 32.0–36.0)
MCHC: 34.6 g/dL (ref 32.0–36.0)
MCV: 93.5 fL (ref 80.0–100.0)
MCV: 92.2 fL (ref 80.0–100.0)
PLATELETS: 173 10*3/uL (ref 150–440)
PLATELETS: 150 10*3/uL (ref 150–440)
RBC: 2.32 MIL/uL — ABNORMAL LOW (ref 4.40–5.90)
RBC: 2.41 MIL/uL — AB (ref 4.40–5.90)
RDW: 20.1 % — ABNORMAL HIGH (ref 11.5–14.5)
RDW: 20 % — AB (ref 11.5–14.5)
WBC: 9.2 10*3/uL (ref 3.8–10.6)
WBC: 9.7 10*3/uL (ref 3.8–10.6)

## 2018-01-29 MED ORDER — ADULT MULTIVITAMIN W/MINERALS CH
1.0000 | ORAL_TABLET | Freq: Every day | ORAL | Status: DC
  Administered 2018-01-29 – 2018-01-30 (×2): 1 via ORAL
  Filled 2018-01-29 (×2): qty 1

## 2018-01-29 MED ORDER — BOOST / RESOURCE BREEZE PO LIQD CUSTOM
1.0000 | Freq: Three times a day (TID) | ORAL | Status: DC
  Administered 2018-01-29 – 2018-01-30 (×3): 1 via ORAL

## 2018-01-29 MED ORDER — FOLIC ACID 1 MG PO TABS
1.0000 mg | ORAL_TABLET | Freq: Every day | ORAL | Status: DC
  Administered 2018-01-30: 1 mg via ORAL
  Filled 2018-01-29: qty 1

## 2018-01-29 MED ORDER — VITAMIN B-1 100 MG PO TABS
100.0000 mg | ORAL_TABLET | Freq: Every day | ORAL | Status: DC
  Administered 2018-01-30: 09:00:00 100 mg via ORAL
  Filled 2018-01-29: qty 1

## 2018-01-29 NOTE — Progress Notes (Signed)
Patient requesting to walk outside, consulted with Dr. Amado Coe, Dr. Amado Coe stated if hemoglobin is above 7, patient may walk outside with this nurse.

## 2018-01-29 NOTE — Plan of Care (Signed)

## 2018-01-29 NOTE — Progress Notes (Signed)
Initial Nutrition Assessment  DOCUMENTATION CODES:   Not applicable  INTERVENTION:   Boost Breeze po TID, each supplement provides 250 kcal and 9 grams of protein  MVI daily  Thiamine and folic acid in setting of etoh abuse   NUTRITION DIAGNOSIS:   Inadequate oral intake related to acute illness as evidenced by other (comment)(pt on clear liquid diet).  GOAL:   Patient will meet greater than or equal to 90% of their needs  MONITOR:   PO intake, Supplement acceptance, Diet advancement, Labs, Weight trends, Skin, I & O's  REASON FOR ASSESSMENT:   Malnutrition Screening Tool    ASSESSMENT:   57 y.o. y/o male with a severe GI bleed who was admitted to [facility] on 01/22/18 , had 11 units prbcs, EGD showed non bleeding ulcer, plan was for colonoscopy, pt left AMA and now admitted to [facility] for furthur workup. Pt with h/o severe aortic stenosis, hep C, DVT, PE and etoh abuse    Pt s/p colonoscopy 8/31 noted to have multiple polyps, hemorrhoids, and 2 colonic angiectasias   Met with pt in room today. Pt reports good appetite and oral intake at baseline. Pt reports that he is hungry today and ready to eat. Pt reports that he has not really eaten anything solid for ~10 days as he has been on NPO/clear liquid diet for procedures and GIB. Pt eating 100% of his clear liquids; reports he is not getting enough to eat. Pt reports no further diarrhea or bloody stools. Pt s/p capsule study reports capsule given to nurse and he is awaiting results. RD will order peach Boost Breeze to help pt meet his estimated needs. Pt would like to have chocolate Ensure when diet advanced. Recommend thiamine, folic acid and MVI in setting of etoh abuse. Pt reports stable wt pta with UBW ~170lbs; there is no weight history in chart to confirm.    Medications reviewed and include: protonix  Labs reviewed: Hgb 7.6(L), Hct 22.6(L)  NUTRITION - FOCUSED PHYSICAL EXAM:    Most Recent Value  Orbital Region  Mild  depletion  Upper Arm Region  Mild depletion  Thoracic and Lumbar Region  Mild depletion  Buccal Region  Mild depletion  Temple Region  Mild depletion  Clavicle Bone Region  Mild depletion  Clavicle and Acromion Bone Region  Mild depletion  Scapular Bone Region  Mild depletion  Dorsal Hand  Mild depletion  Patellar Region  Moderate depletion  Anterior Thigh Region  Moderate depletion  Posterior Calf Region  Moderate depletion  Edema (RD Assessment)  None  Hair  Reviewed  Eyes  Reviewed  Mouth  Reviewed  Skin  Reviewed  Nails  Reviewed     Diet Order:   Diet Order            Diet clear liquid Room service appropriate? Yes; Fluid consistency: Thin  Diet effective now             EDUCATION NEEDS:   Education needs have been addressed  Skin:  Skin Assessment: Reviewed RN Assessment  Last BM:  8/31- type 7  Height:   Ht Readings from Last 1 Encounters:  01/27/18 6' (1.829 m)    Weight:   Wt Readings from Last 1 Encounters:  01/27/18 77.1 kg    Ideal Body Weight:  80.9 kg  BMI:  Body mass index is 23.06 kg/m.  Estimated Nutritional Needs:   Kcal:  2100-2400kcal/day   Protein:  85-100g/day   Fluid:  >2.0L/day  Jakaiden Fill MS, RD, LDN Pager #- [phone] Office#- [phone] After Hours Pager: [phone]  

## 2018-01-29 NOTE — Progress Notes (Signed)
[facility] - [place] at [facility]   PATIENT NAME: James Shepherd    MR#:  [id number]  DATE OF BIRTH:  1960-09-13  SUBJECTIVE:  CHIEF COMPLAINT: Patient is  out of bed to chair.  Tolerating clear liquid diet.   denies any chest pain or abdominal pain.  Denies any active bleeding.  Hemoglobin at 7.4  REVIEW OF SYSTEMS:  CONSTITUTIONAL: No fever, fatigue or weakness.  EYES: No blurred or double vision.  EARS, NOSE, AND THROAT: No tinnitus or ear pain.  RESPIRATORY: No cough, shortness of breath, wheezing or hemoptysis.  CARDIOVASCULAR: No chest pain, orthopnea, edema.  GASTROINTESTINAL: No nausea, vomiting, diarrhea or abdominal pain.  GENITOURINARY: No dysuria, hematuria.  ENDOCRINE: No polyuria, nocturia,  HEMATOLOGY: No anemia, easy bruising or bleeding SKIN: No rash or lesion. MUSCULOSKELETAL: No joint pain or arthritis.   NEUROLOGIC: No tingling, numbness, weakness.  PSYCHIATRY: No anxiety or depression.   DRUG ALLERGIES:  No Known Allergies  VITALS:  Blood pressure (!) 108/50, pulse 81, temperature 98.4 F (36.9 C), temperature source Oral, resp. rate 14, height 6' (1.829 m), weight 77.1 kg, SpO2 99 %.  PHYSICAL EXAMINATION:  GENERAL:  57 y.o.-year-old patient lying in the bed with no acute distress.  EYES: Pupils equal, round, reactive to light and accommodation. No scleral icterus. Extraocular muscles intact.  HEENT: Head atraumatic, normocephalic. Oropharynx and nasopharynx clear.  NECK:  Supple, no jugular venous distention. No thyroid enlargement, no tenderness.  LUNGS: Normal breath sounds bilaterally, no wheezing, rales,rhonchi or crepitation. No use of accessory muscles of respiration.  CARDIOVASCULAR: S1, S2 normal. No murmurs, rubs, or gallops.  ABDOMEN: Soft, nontender, nondistended. Bowel sounds present.   EXTREMITIES: No pedal edema, cyanosis, or clubbing.  NEUROLOGIC: Cranial nerves II through XII are intact. Sensation intact. Gait not  checked.  PSYCHIATRIC: The patient is alert and oriented x 3.  SKIN: No obvious rash, lesion, or ulcer.    LABORATORY PANEL:   CBC Recent Labs  Lab 01/28/18 2351  WBC 9.7  HGB 7.4*  HCT 21.4*  PLT 150   ------------------------------------------------------------------------------------------------------------------  Chemistries  Recent Labs  Lab 01/27/18 1445  01/28/18 2351  NA 134*   < > 137  K 4.2   < > 4.2  CL 103   < > 106  CO2 27   < > 26  GLUCOSE 125*   < > 112*  BUN 26*   < > 14  CREATININE 1.03   < > 1.03  CALCIUM 7.4*   < > 7.6*  AST 29  --   --   ALT 14  --   --   ALKPHOS 24*  --   --   BILITOT 0.7  --   --    < > = values in this interval not displayed.   ------------------------------------------------------------------------------------------------------------------  Cardiac Enzymes Recent Labs  Lab 01/28/18 2351  TROPONINI 0.06*   ------------------------------------------------------------------------------------------------------------------  RADIOLOGY:  No results found.  EKG:   Orders placed or performed during the hospital encounter of 01/27/18  . EKG 12-Lead  . EKG 12-Lead    ASSESSMENT AND PLAN:   #.  Gastrointestinal bleeding, acute blood loss anemia with recent endoscopy showing a small gastric ulcer that was nonbleeding.   Serial hemoglobins 7.5-7.4-5.8 - PRBC-8.3-7.9-7.4  Gastrointestinal consultation discussed with Dr. Tobi Bastos.  Colonoscopy done on January 28, 2018 Iron studies, B12 and folate are normal - Many 5 to 8 mm, non-bleeding polyps in the entire colon. Resection not attempted - Two  non-bleeding colonic angioectasias. Treated with argon plasma coagulation (APC). -- Non-bleeding internal hemorrhoids. - Await pathology results. -Capsule study done awaiting results.If capsule study is negative then will need Meckels scan - Repeat colonoscopy as an outpatient due to poor prep and presence of multiple  polyps   #Elevated troponin but patient is a symptomatic denies any chest pain or shortness of breath Troponin at 0.06 probably from demand ischemia Consult placed and discussed with Dr. Lady Orestes .Marland Kitchen  Cleared for colonoscopy and no interventions needed from cardiology standpoint at this time  # .  Severe aortic stenosis seen on prior echocardiogram. Patient seen by Dr. Gwen Pounds in the past.  Outpatient follow-up with cardiology Dr. Gwen Pounds  #   Impaired fasting glucose   hemoglobin A1c 5.1  #   Tobacco abuse.  Smoking cessation counseling done 4 minutes by me.  No need for nicotine patch because he only smokes a few cigarettes per day.  # .  Drinks a couple beers per day.  He will have to stop this.  Counseled patient to stop drinking.  Outpatient alcohol Anonymous  # .  Thrombocytopenia check hepatitis C Platelet count 1 50,000  #.  History of DVT and PE, history of thrombus and atrial appendage.  With major GI bleed, Unlikely that we will be able to put him back on the Coumadin right at this point.  Continue close monitoring     All the records are reviewed and case discussed with Care Management/Social Workerr. Management plans discussed with the patient, family and they are in agreement.  CODE STATUS: FC   TOTAL TIME TAKING CARE OF THIS PATIENT: 36  minutes.   POSSIBLE D/C IN 1-2  DAYS, DEPENDING ON CLINICAL CONDITION.  Note: This dictation was prepared with Dragon dictation along with smaller phrase technology. Any transcriptional errors that result from this process are unintentional.   [name] M.D on 01/29/2018 at 8:08 AM  Between 7am to 6pm - Pager - [phone] After 6pm go to [email or web address] - password [email or web address] [facility]  [place] [facility]  Office  [phone]  CC: Primary care physician; Leanna Sato, MD

## 2018-01-29 NOTE — Progress Notes (Signed)
   Wyline Mood , MD [address], [address], [place], [place], [address] [address], [place], [place], [address] Phone: [phone]  Fax: [phone]   James Shepherd is being followed for overt obscure GI bleed  Day 1 of follow up   Subjective: No bowel movements since yesterday- feels well    Objective: Vital signs in last 24 hours: Vitals:   01/28/18 2332 01/29/18 0431 01/29/18 0821 01/29/18 1343  BP: (!) 128/51 (!) 108/50 (!) 117/50 (!) 124/42  Pulse: 69 81 60 63  Resp: 14 14 16 18   Temp: 98.3 F (36.8 C) 98.4 F (36.9 C) 99 F (37.2 C) 98.6 F (37 C)  TempSrc: Oral Oral Oral Oral  SpO2: 98% 99% 100% 100%  Weight:      Height:       Weight change:   Intake/Output Summary (Last 24 hours) at 01/29/2018 1524 Last data filed at 01/29/2018 1358 Gross per 24 hour  Intake 1130 ml  Output -  Net 1130 ml     Exam: Heart:: Regular rate and rhythm, S1S2 present ,systolic murmur hear in aortic area no extra heart sounds Lungs: normal, clear to auscultation and clear to auscultation and percussion Abdomen: soft, nontender, normal bowel sounds   Lab Results: @LABTEST2 @ Micro Results: No results found for this or any previous visit (from the past 240 hour(s)). Studies/Results: No results found. Medications: I have reviewed the patient's current medications. Scheduled Meds: . feeding supplement  1 Container Oral TID BM  . [START ON 01/30/2018] folic acid  1 mg Oral Daily  . multivitamin with minerals  1 tablet Oral Daily  . pantoprazole  40 mg Oral BID AC  . [START ON 01/30/2018] thiamine  100 mg Oral Daily   Continuous Infusions: PRN Meds:.acetaminophen **OR** acetaminophen, ondansetron **OR** ondansetron (ZOFRAN) IV, oxyCODONE   Assessment: Active Problems:   GI bleed James Shepherd is a 57 y.o. y/o male with a severe GI bleed who was admitted to [facility] on 01/22/18 , had 11 units transfusion, EGD showed non bleeding ulcer, plan was for colonoscopy, he left AMA,  h/o severe aortic stenosis. Amitted with further rectal bleeding and colonoscopy on 01/28/18 showed 2x non bleeding colon AVM which I treated with APC, very large rectal polyp excised and clips placed, multiple smaller polyps seen but not resected in colon. Poor prep. Small bowel capsule shows no bleeding in stomach,duodenum , proximal small bowel, as the study proceeded to the distal small bowel the visualization became poor and there was dark Wands/black liquid- no bright red seen , This may be the location of a bleed that has occurred but no site could be seen due to the large qty of liquid.The large rectal polyp could also have been the source of bleeding as he was on coumadin.  S/p 2 units PRBC at [facility]. Stable overnight   Plan  1. Meckels scan to r/o Meckels diverticulum  2. Monitor CBC- if scan is negative and CBC stable then can advance diet tonight . If Scan is abnormal then consult surgery. Otherwise if scan is normal and still drops HB then needs tagged RBC scan with vascular vs general surgery evaluation.      LOS: 2 days   Wyline Mood, MD 01/29/2018, 3:24 PM

## 2018-01-30 LAB — CBC
HCT: 21.7 % — ABNORMAL LOW (ref 40.0–52.0)
HEMOGLOBIN: 7.3 g/dL — AB (ref 13.0–18.0)
MCH: 32 pg (ref 26.0–34.0)
MCHC: 33.8 g/dL (ref 32.0–36.0)
MCV: 94.8 fL (ref 80.0–100.0)
Platelets: 184 10*3/uL (ref 150–440)
RBC: 2.29 MIL/uL — ABNORMAL LOW (ref 4.40–5.90)
RDW: 20.8 % — AB (ref 11.5–14.5)
WBC: 7.3 10*3/uL (ref 3.8–10.6)

## 2018-01-30 LAB — HAPTOGLOBIN: HAPTOGLOBIN: 116 mg/dL (ref 34–200)

## 2018-01-30 NOTE — Care Management Important Message (Signed)
Important Message  Patient Details  Name: James Shepherd MRN: [id number] Date of Birth: [birth year]   Medicare Important Message Given:  Yes    Olegario Messier A Rechel Delosreyes 01/30/2018, 12:07 PM

## 2018-01-30 NOTE — Progress Notes (Signed)
Patient has been irritable throughout shift due to wanting to leave the hospital.  I have educated patient several times about it being a holiday and that the department that does the test he needs is not here today.  Patient finally decided that he has had enough and wants to leave AMA.  Dr. Elpidio Anis paged and he is aware.  AMA paper signed and patient took his belongings and left the room.  Orson Ape, RN, BSN

## 2018-01-30 NOTE — Progress Notes (Signed)
[facility] Physicians - [place] at [facility]   PATIENT NAME: James Shepherd    MR#:  [id number]  DATE OF BIRTH:  1961-05-17  SUBJECTIVE:   No abdominal pain.  Tolerating liquid diet.  REVIEW OF SYSTEMS:  CONSTITUTIONAL: No fever, fatigue or weakness.  EYES: No blurred or double vision.  EARS, NOSE, AND THROAT: No tinnitus or ear pain.  RESPIRATORY: No cough, shortness of breath, wheezing or hemoptysis.  CARDIOVASCULAR: No chest pain, orthopnea, edema.  GASTROINTESTINAL: No nausea, vomiting, diarrhea or abdominal pain.  GENITOURINARY: No dysuria, hematuria.  ENDOCRINE: No polyuria, nocturia,  HEMATOLOGY: No anemia, easy bruising or bleeding SKIN: No rash or lesion. MUSCULOSKELETAL: No joint pain or arthritis.   NEUROLOGIC: No tingling, numbness, weakness.  PSYCHIATRY: No anxiety or depression.   DRUG ALLERGIES:  No Known Allergies  VITALS:  Blood pressure (!) 133/55, pulse (!) 57, temperature 98.4 F (36.9 C), temperature source Oral, resp. rate 18, height 6' (1.829 m), weight 77.1 kg, SpO2 100 %.  PHYSICAL EXAMINATION:  GENERAL:  57 y.o.-year-old patient lying in the bed with no acute distress.  EYES: Pupils equal, round, reactive to light and accommodation. No scleral icterus. Extraocular muscles intact.  HEENT: Head atraumatic, normocephalic. Oropharynx and nasopharynx clear.  NECK:  Supple, no jugular venous distention. No thyroid enlargement, no tenderness.  LUNGS: Normal breath sounds bilaterally, no wheezing, rales,rhonchi or crepitation. No use of accessory muscles of respiration.  CARDIOVASCULAR: S1, S2 normal. No murmurs, rubs, or gallops.  ABDOMEN: Soft, nontender, nondistended. Bowel sounds present.   EXTREMITIES: No pedal edema, cyanosis, or clubbing.  NEUROLOGIC: Cranial nerves II through XII are intact. Sensation intact. Gait not checked.  PSYCHIATRIC: The patient is alert and oriented x 3.  SKIN: No obvious rash, lesion, or ulcer.     LABORATORY PANEL:   CBC Recent Labs  Lab 01/30/18 0636  WBC 7.3  HGB 7.3*  HCT 21.7*  PLT 184   ------------------------------------------------------------------------------------------------------------------  Chemistries  Recent Labs  Lab 01/27/18 1445  01/28/18 2351  NA 134*   < > 137  K 4.2   < > 4.2  CL 103   < > 106  CO2 27   < > 26  GLUCOSE 125*   < > 112*  BUN 26*   < > 14  CREATININE 1.03   < > 1.03  CALCIUM 7.4*   < > 7.6*  AST 29  --   --   ALT 14  --   --   ALKPHOS 24*  --   --   BILITOT 0.7  --   --    < > = values in this interval not displayed.   ------------------------------------------------------------------------------------------------------------------  Cardiac Enzymes Recent Labs  Lab 01/28/18 2351  TROPONINI 0.06*   ------------------------------------------------------------------------------------------------------------------  RADIOLOGY:  No results found.  EKG:   Orders placed or performed during the hospital encounter of 01/27/18  . EKG 12-Lead  . EKG 12-Lead    ASSESSMENT AND PLAN:   #.  Gastrointestinal bleeding, acute blood loss anemia with recent endoscopy showing a small gastric ulcer that was nonbleeding.  Hemoglobin stable today. Discussed with Dr. Tobi Bastos.  Colonoscopy done on January 28, 2018 Iron studies, B12 and folate are normal - Many 5 to 8 mm, non-bleeding polyps in the entire colon. Resection not attempted.  We will follow-up as outpatient with GI - Two non-bleeding colonic angioectasias. Treated with argon plasma coagulation (APC). Non-bleeding internal hemorrhoids.Await pathology results. -Capsule study done and negative.  Medical scan  ordered. - Repeat colonoscopy as an outpatient due to poor prep and presence of multiple polyps Discussed with patient regarding pending medical scan and need for follow-up hemoglobin in the morning.  Advance to soft diet.  #Elevated troponin but patient is a symptomatic  denies any chest pain or shortness of breath Troponin at 0.06 probably from demand ischemia Consult placed and discussed with Dr. Lady Carston . Cleared for colonoscopy and no interventions needed from cardiology standpoint at this time  # .  Severe aortic stenosis seen on prior echocardiogram. Patient seen by Dr. Gwen Pounds in the past.  Outpatient follow-up with cardiology Dr. Gwen Pounds  #   Impaired fasting glucose   hemoglobin A1c 5.1  #   Tobacco abuse.  Smoking cessation counseling done 4 minutes by me.  No need for nicotine patch because he only smokes a few cigarettes per day.  # .  Drinks a couple beers per day.  He will have to stop this.  Counseled patient to stop drinking.  Outpatient alcohol Anonymous  # .  Thrombocytopenia  Platelet count 150,000  #.  History of DVT and PE, history of thrombus and atrial appendage.  With major GI bleed, Unlikely that we will be able to put him back on the Coumadin right at this point.  Continue close monitoring  All the records are reviewed and case discussed with Care Management/Social Workerr. Management plans discussed with the patient, family and they are in agreement.  CODE STATUS: Full code  TOTAL TIME TAKING CARE OF THIS PATIENT: 36  minutes.   Patient requesting to be discharged home.  Discussed with Dr. Tobi Bastos.  Patient will need medical scan and repeat hemoglobin tomorrow.  Note: This dictation was prepared with Dragon dictation along with smaller phrase technology. Any transcriptional errors that result from this process are unintentional.   Orie Fisherman M.D on 01/30/2018 at 12:58 PM  Between 7am to 6pm - Pager - [phone]  After 6pm go to [email or web address] - password [email or web address] [facility]  [place] [facility]  Office  [phone]  CC: Primary care physician; Leanna Sato, MD

## 2018-01-31 ENCOUNTER — Encounter: Payer: Self-pay | Admitting: Gastroenterology

## 2018-01-31 LAB — HIV ANTIBODY (ROUTINE TESTING W REFLEX): HIV Screen 4th Generation wRfx: NONREACTIVE

## 2018-01-31 LAB — TYPE AND SCREEN
ABO/RH(D): AB NEG
ANTIBODY SCREEN: NEGATIVE
Unit division: 0
Unit division: 0

## 2018-01-31 LAB — BPAM RBC
BLOOD PRODUCT EXPIRATION DATE: 201909132359
Blood Product Expiration Date: 201909112359
ISSUE DATE / TIME: 201908310755
UNIT TYPE AND RH: 1700
Unit Type and Rh: 1700

## 2018-01-31 LAB — HEPATITIS C ANTIBODY: HCV Ab: <0.1 s/co ratio (ref 0.0–0.9)

## 2018-02-01 LAB — SURGICAL PATHOLOGY

## 2018-02-02 ENCOUNTER — Encounter: Payer: Self-pay | Admitting: Gastroenterology

## 2018-02-05 ENCOUNTER — Encounter: Payer: Self-pay | Admitting: Gastroenterology

## 2018-02-06 ENCOUNTER — Telehealth: Payer: Self-pay

## 2018-02-06 NOTE — Telephone Encounter (Signed)
Called pt to inform him of biopsy results and Dr. Johnney Killian instructions for a repeat colonoscopy. No answer, no VM set up.

## 2018-02-06 NOTE — Telephone Encounter (Signed)
-----   Message from Kiran Anna, MD sent at 02/05/2018  5:55 PM EDT ----- James Shepherd inform polyp was pre cancerous and repeat colonoscopy suggested in 6 weeks due to very poor prep . He can have it done with us or with his GI at [facility] .Please offer an appointment if he choses 

## 2018-02-07 NOTE — Discharge Summary (Signed)
[facility] - [place] at [facility]   PATIENT NAME: James Shepherd    MR#:  [id number]  DATE OF BIRTH:  08-15-1960  DATE OF ADMISSION:  01/27/2018 ADMITTING PHYSICIAN: Alford Highland, MD  DATE OF DISCHARGE: 01/30/2018  3:26 PM  PRIMARY CARE PHYSICIAN: Leanna Sato, MD   ADMISSION DIAGNOSIS:  Acute lower GI bleeding [K92.2] Acute anemia [D64.9]  DISCHARGE DIAGNOSIS:  Active Problems:   GI bleed   SECONDARY DIAGNOSIS:   Past Medical History:  Diagnosis Date  . Aortic stenosis   . Arthritis   . GI bleeding   . PE (pulmonary thromboembolism) (HCC)   . Thrombus of atrial appendage      ADMITTING HISTORY  HISTORY OF PRESENT ILLNESS:  Jesusmanuel Erbes  is a 57 y.o. male with a known history of signing out AGAINST MEDICAL ADVICE this morning from [facility].  He states that he was there for quite a bit of time with bleeding which started out as melena.  He received 11 units of blood.  They found a small gastric ulcer on endoscopy.  He took the colonoscopy prep on Wednesday and Thursday night and then they ended up canceling the colonoscopy.  He signed out AGAINST MEDICAL ADVICE.  Patient does feel fatigued.  Has some nausea.  No abdominal pain.  Today he he stopped at [employer] and had a bowel movement which showed bright red bowel movement that he had.  He decided to come at [facility] for further evaluation.  The ER physician spoke with Dr. Servando Snare gastroenterology who recommended a colonoscopy prep tonight for procedure tomorrow.  HOSPITAL COURSE:   *Gastrointestinal bleeding with acute blood loss anemia Recent endoscopy showed nonbleeding gastric ulcer.  Hemoglobin slowly stabilized.  Recent colonoscopy showed polyps which were resected and biopsy pending.  Also had 2 nonbleeding colonic angiectasia's Capsule study was done and negative.  A Meckel scan was ordered. While waiting for further testing patient left AGAINST MEDICAL ADVICE.  Advised him to return if  any worsening symptoms.  #History of DVT and pulmonary embolism with thrombus of atrial appendage.  Coumadin stopped due to GI bleed.  Unfortunately complete work-up could not be done as patient left AGAINST MEDICAL ADVICE.  CONSULTS OBTAINED:  Treatment Team:  Alford Highland, MD Dalia Heading, MD Wyline Mood, MD  DRUG ALLERGIES:  No Known Allergies  DISCHARGE MEDICATIONS:   Allergies as of 01/30/2018   No Known Allergies     Medication List    ASK your doctor about these medications   pantoprazole 40 MG tablet Commonly known as:  PROTONIX Take 40 mg by mouth 2 (two) times daily.   warfarin 10 MG tablet Commonly known as:  COUMADIN Take 10 mg by mouth daily.       Today   VITAL SIGNS:  Blood pressure (!) 133/55, pulse (!) 57, temperature 98.4 F (36.9 C), temperature source Oral, resp. rate 18, height 6' (1.829 m), weight 77.1 kg, SpO2 100 %.  I/O:  No intake or output data in the 24 hours ending 02/07/18 1510  PHYSICAL EXAMINATION:  Physical Exam  GENERAL:  57 y.o.-year-old patient lying in the bed with no acute distress.  LUNGS: Normal breath sounds bilaterally, no wheezing, rales,rhonchi or crepitation. No use of accessory muscles of respiration.  CARDIOVASCULAR: S1, S2 normal. No murmurs, rubs, or gallops.  ABDOMEN: Soft, non-tender, non-distended. Bowel sounds present. No organomegaly or mass.  NEUROLOGIC: Moves all 4 extremities. PSYCHIATRIC: The patient is alert and oriented  x 3.  SKIN: No obvious rash, lesion, or ulcer.   DATA REVIEW:   CBC No results for input(s): WBC, HGB, HCT, PLT in the last 168 hours.  Chemistries  No results for input(s): NA, K, CL, CO2, GLUCOSE, BUN, CREATININE, CALCIUM, MG, AST, ALT, ALKPHOS, BILITOT in the last 168 hours.  Invalid input(s): GFRCGP  Cardiac Enzymes No results for input(s): TROPONINI in the last 168 hours.  Microbiology Results  No results found for this or any previous visit.  RADIOLOGY:  No  results found.  Follow up with PCP in 1 week.  Management plans discussed with the patient, family and they are in agreement.  CODE STATUS:  Code Status History    Date Active Date Inactive Code Status Order ID Comments User Context   01/27/2018 1654 01/30/2018 1832 Full Code [id number]  Alford Highland, MD ED      TOTAL TIME TAKING CARE OF THIS PATIENT ON DAY OF DISCHARGE: more than 30 minutes.   Molinda Bailiff Heman Que M.D on 02/07/2018 at 3:10 PM  Between 7am to 6pm - Pager - [phone]  After 6pm go to [email or web address] - password [email or web address] [facility]  [facility] [place] [facility]  Office  [phone]  CC: Primary care physician; Leanna Sato, MD  Note: This dictation was prepared with Dragon dictation along with smaller phrase technology. Any transcriptional errors that result from this process are unintentional.

## 2018-02-10 ENCOUNTER — Telehealth: Payer: Self-pay

## 2018-02-10 NOTE — Telephone Encounter (Signed)
-----   Message from Wyline MoodKiran Anna, MD sent at 02/05/2018  5:55 PM EDT ----- James MeekJadijah inform polyp was pre cancerous and repeat colonoscopy suggested in 6 weeks due to very poor prep . He can have it done with [facility] .Please offer an appointment if he choses

## 2018-02-10 NOTE — Telephone Encounter (Signed)
Called pt to inform him of biopsy results and Dr. Johnney KillianAnna's instructions to schedule a repeat colonoscopy. Unable to contact pt via phone. A letter will be mailed to pt.

## 2018-02-20 ENCOUNTER — Telehealth: Payer: Self-pay

## 2018-02-20 NOTE — Telephone Encounter (Signed)
-----   Message from Wyline MoodKiran Anna, MD sent at 02/05/2018  5:55 PM EDT ----- Denton MeekJadijah inform polyp was pre cancerous and repeat colonoscopy suggested in 6 weeks due to very poor prep . He can have it done with us or [facility] .Please offer an appointment if he choses

## 2018-02-20 NOTE — Telephone Encounter (Signed)
Tried contacting pt again via phone. Unable to contact. No voicemail set up.

## 2018-02-20 NOTE — Progress Notes (Signed)
Multiple attempts to contact pt via phone, also sent letter by mail. Emergency contact number is not a direct line. Pt has not responded.

## 2019-02-26 ENCOUNTER — Emergency Department
Admission: EM | Admit: 2019-02-26 | Discharge: 2019-02-26 | Disposition: A | Discharge status: Home / Self Care | Payer: Medicare Other | Attending: Student in an Organized Health Care Education/Training Program | Admitting: Student in an Organized Health Care Education/Training Program

## 2019-02-26 ENCOUNTER — Encounter: Payer: Self-pay | Admitting: Emergency Medicine

## 2019-02-26 ENCOUNTER — Other Ambulatory Visit: Payer: Self-pay

## 2019-02-26 ENCOUNTER — Emergency Department: Payer: Medicare Other

## 2019-02-26 DIAGNOSIS — I350 Nonrheumatic aortic (valve) stenosis: Secondary | ICD-10-CM

## 2019-02-26 DIAGNOSIS — Z79899 Other long term (current) drug therapy: Secondary | ICD-10-CM | POA: Insufficient documentation

## 2019-02-26 DIAGNOSIS — Z7901 Long term (current) use of anticoagulants: Secondary | ICD-10-CM | POA: Insufficient documentation

## 2019-02-26 DIAGNOSIS — R2243 Localized swelling, mass and lump, lower limb, bilateral: Secondary | ICD-10-CM | POA: Diagnosis not present

## 2019-02-26 DIAGNOSIS — R0789 Other chest pain: Secondary | ICD-10-CM | POA: Diagnosis not present

## 2019-02-26 DIAGNOSIS — Z86711 Personal history of pulmonary embolism: Secondary | ICD-10-CM | POA: Diagnosis not present

## 2019-02-26 DIAGNOSIS — R0602 Shortness of breath: Secondary | ICD-10-CM | POA: Diagnosis not present

## 2019-02-26 DIAGNOSIS — F17210 Nicotine dependence, cigarettes, uncomplicated: Secondary | ICD-10-CM | POA: Insufficient documentation

## 2019-02-26 LAB — PROTIME-INR
INR: 2.5 — ABNORMAL HIGH (ref 0.8–1.2)
Prothrombin Time: 26.3 seconds — ABNORMAL HIGH (ref 11.4–15.2)

## 2019-02-26 LAB — CBC
HCT: 41.8 % (ref 39.0–52.0)
Hemoglobin: 14.5 g/dL (ref 13.0–17.0)
MCH: 32.3 pg (ref 26.0–34.0)
MCHC: 34.7 g/dL (ref 30.0–36.0)
MCV: 93.1 fL (ref 80.0–100.0)
Platelets: 206 10*3/uL (ref 150–400)
RBC: 4.49 MIL/uL (ref 4.22–5.81)
RDW: 15.8 % — ABNORMAL HIGH (ref 11.5–15.5)
WBC: 7.7 10*3/uL (ref 4.0–10.5)
nRBC: 0 % (ref 0.0–0.2)

## 2019-02-26 LAB — BASIC METABOLIC PANEL
Anion gap: 11 (ref 5–15)
BUN: 17 mg/dL (ref 6–20)
CO2: 27 mmol/L (ref 22–32)
Calcium: 9.7 mg/dL (ref 8.9–10.3)
Chloride: 101 mmol/L (ref 98–111)
Creatinine, Ser: 0.96 mg/dL (ref 0.61–1.24)
GFR calc Af Amer: >60 mL/min (ref >60)
GFR calc non Af Amer: >60 mL/min (ref >60)
Glucose, Bld: 121 mg/dL — ABNORMAL HIGH (ref 70–99)
Potassium: 3.9 mmol/L (ref 3.5–5.1)
Sodium: 139 mmol/L (ref 135–145)

## 2019-02-26 LAB — TROPONIN I (HIGH SENSITIVITY)
Troponin I (High Sensitivity): 32 ng/L — ABNORMAL HIGH (ref <18)
Troponin I (High Sensitivity): 36 ng/L — ABNORMAL HIGH (ref <18)

## 2019-02-26 LAB — BRAIN NATRIURETIC PEPTIDE: B Natriuretic Peptide: 163 pg/mL — ABNORMAL HIGH (ref 0.0–100.0)

## 2019-02-26 MED ORDER — SODIUM CHLORIDE 0.9% FLUSH: 3.0000 mL | Freq: Once | INTRAVENOUS | Status: DC

## 2019-02-26 MED ORDER — FUROSEMIDE 40 MG PO TABS
40.0000 mg | ORAL_TABLET | Freq: Once | ORAL | Status: AC
  Administered 2019-02-26: 40 mg via ORAL
  Filled 2019-02-26: qty 1

## 2019-02-26 NOTE — ED Provider Notes (Signed)
[facility] Emergency Department Provider Note    First MD Initiated Contact with Patient 02/26/19 1306     (approximate)  I have reviewed the triage vital signs and the nursing notes.   HISTORY  Chief Complaint Shortness of Breath and Chest Pain    HPI James Shepherd is a 58 y.o. male with the below listed past medical history presents the ER for evaluation of intermittent chest pain orthopnea shortness of breath and leg swelling.  States his symptoms have been progressively worsening over the past 5 days.  States that symptoms last 5 to 10 minutes particularly as he gets out of bed.  States he was mowing the lawn yesterday he did have some discomfort but was able to sit down.  States he is primarily having shortness of breath and weakness.  No chest pain.  States he does 1500 crunches per day and did not have any chest pain or shortness of breath during that.  Does have a history of aortic stenosis as well as stable angina.  Is on Coumadin for history of PE.  Denies any pain at this moment. No pressure or shortness of breath.   Past Medical History:  Diagnosis Date   Aortic stenosis    Arthritis    GI bleeding    PE (pulmonary thromboembolism) (HCC)    Thrombus of atrial appendage    Family History  Problem Relation Age of Onset   CAD Mother    Past Surgical History:  Procedure Laterality Date   COLONOSCOPY WITH PROPOFOL N/A 01/28/2018   Procedure: COLONOSCOPY WITH PROPOFOL;  Surgeon: Jonathon Bellows, MD;  Location: [facility] ENDOSCOPY;  Service: Gastroenterology;  Laterality: N/A;   NO PAST SURGERIES     Patient Active Problem List   Diagnosis Date Noted   GI bleed 01/27/2018      Prior to Admission medications   Medication Sig Start Date End Date Taking? Authorizing Provider  pantoprazole (PROTONIX) 40 MG tablet Take 40 mg by mouth 2 (two) times daily.    [provider]  warfarin (COUMADIN) 10 MG tablet Take 10 mg by mouth daily.     [provider]    Allergies Patient has no known allergies.    Social History Social History   Tobacco Use   Smoking status: Current Every Day Smoker   Smokeless tobacco: Never Used  Substance Use Topics   Alcohol use: Yes    Comment: 2 beers per day   Drug use: Never    Review of Systems Patient denies headaches, rhinorrhea, blurry vision, numbness, shortness of breath, chest pain, edema, cough, abdominal pain, nausea, vomiting, diarrhea, dysuria, fevers, rashes or hallucinations unless otherwise stated above in HPI. ____________________________________________   PHYSICAL EXAM:  VITAL SIGNS: Vitals:   02/26/19 1326 02/26/19 1400  BP: (!) 169/67 (!) 173/65  Pulse: 86 65  Resp: 16 18  Temp:    SpO2: 98% 99%    Constitutional: Alert and oriented.  Eyes: Conjunctivae are normal.  Head: Atraumatic. Nose: No congestion/rhinnorhea. Mouth/Throat: Mucous membranes are moist.   Neck: No stridor. Painless ROM.  Cardiovascular: Normal rate, regular rhythm. Grossly normal heart sounds.  Good peripheral circulation. Respiratory: Normal respiratory effort.  No retractions. Lungs CTAB. Gastrointestinal: Soft and nontender. No distention. No abdominal bruits. No CVA tenderness. Genitourinary:  Musculoskeletal: No lower extremity tenderness nor edema.  No joint effusions. Neurologic:  Normal speech and language. No gross focal neurologic deficits are appreciated. No facial droop Skin:  Skin is warm,  dry and intact. No rash noted. Psychiatric: Mood and affect are normal. Speech and behavior are normal.  ____________________________________________   LABS (all labs ordered are listed, but only abnormal results are displayed)  Results for orders placed or performed during the hospital encounter of 02/26/19 (from the past 24 hour(s))  Basic metabolic panel     Status: Abnormal   Collection Time: 02/26/19 11:19 AM  Result Value Ref Range   Sodium 139 135 - 145  mmol/L   Potassium 3.9 3.5 - 5.1 mmol/L   Chloride 101 98 - 111 mmol/L   CO2 27 22 - 32 mmol/L   Glucose, Bld 121 (H) 70 - 99 mg/dL   BUN 17 6 - 20 mg/dL   Creatinine, Ser 1.610.96 0.61 - 1.24 mg/dL   Calcium 9.7 8.9 - 09.610.3 mg/dL   GFR calc non Af Amer >60 >60 mL/min   GFR calc Af Amer >60 >60 mL/min   Anion gap 11 5 - 15  CBC     Status: Abnormal   Collection Time: 02/26/19 11:19 AM  Result Value Ref Range   WBC 7.7 4.0 - 10.5 K/uL   RBC 4.49 4.22 - 5.81 MIL/uL   Hemoglobin 14.5 13.0 - 17.0 g/dL   HCT 04.541.8 40.939.0 - 81.152.0 %   MCV 93.1 80.0 - 100.0 fL   MCH 32.3 26.0 - 34.0 pg   MCHC 34.7 30.0 - 36.0 g/dL   RDW 91.415.8 (H) 78.211.5 - 95.615.5 %   Platelets 206 150 - 400 K/uL   nRBC 0.0 0.0 - 0.2 %  Troponin I (High Sensitivity)     Status: Abnormal   Collection Time: 02/26/19 11:19 AM  Result Value Ref Range   Troponin I (High Sensitivity) 32 (H) <18 ng/L  Protime-INR     Status: Abnormal   Collection Time: 02/26/19 11:19 AM  Result Value Ref Range   Prothrombin Time 26.3 (H) 11.4 - 15.2 seconds   INR 2.5 (H) 0.8 - 1.2  Brain natriuretic peptide     Status: Abnormal   Collection Time: 02/26/19 11:20 AM  Result Value Ref Range   B Natriuretic Peptide 163.0 (H) 0.0 - 100.0 pg/mL  Troponin I (High Sensitivity)     Status: Abnormal   Collection Time: 02/26/19  1:19 PM  Result Value Ref Range   Troponin I (High Sensitivity) 36 (H) <18 ng/L   ____________________________________________  EKG My review and personal interpretation at Time: 11:16   Indication: sob  Rate: 75  Rhythm: sinus Axis: normal Other: nonspecific st abn, no stemi ____________________________________________  RADIOLOGY  I personally reviewed all radiographic images ordered to evaluate for the above acute complaints and reviewed radiology reports and findings.  These findings were personally discussed with the patient.  Please see medical record for radiology  report.  ____________________________________________   PROCEDURES  Procedure(s) performed:  Procedures    Critical Care performed: no ____________________________________________   INITIAL IMPRESSION / ASSESSMENT AND PLAN / ED COURSE  Pertinent labs & imaging results that were available during my care of the patient were reviewed by me and considered in my medical decision making (see chart for details).   DDX: ACS, pericarditis, esophagitis, boerhaaves, pe, dissection, pna, bronchitis, costochondritis   Harvel RicksGary Pires is a 58 y.o. who presents to the ED with symptoms as described above.  He is currently pain-free in no acute distress.  Afebrile and hemodynamically stable.  No evidence of edema on chest x-ray.  Seems less consistent with bronchitis or COPD exacerbation.  Doubt infectious process.  Possible anginal symptoms though given his history have a higher suspicion for symptomatic left ear.  Has had CAD work-up in the past year and followed by cardiology already on anticoagulation so lower suspicion for PE.  Does not seem consistent with dissection.  His abdominal exam is soft and benign.  Clinical Course as of Feb 25 1438  Mon Feb 26, 2019  1432 Patient remains chest pain-free.  Troponin with nonischemic profile.  Not consistent with acute heart failure given absnce of edema or hypoxia.   Discussed case in consultation with cardiology, Dr. Lady Aydeen.  Have a lower suspicion for ACS and more concerned that he is developing exertional symptoms from his aortic stenosis.  Not consistent with dissection.  Discussed option for admission to the hospital for blood pressure monitoring inpatient echocardiogram and cardiology versus outpatient follow-up.  Patient electing close outpatient follow-up.  Cardiology agrees to see patient in clinic tomorrow and I think that is reasonable.  We discussed signs and symptoms which the patient should return immediately to the hospital.     [PR]    Clinical  Course User Index [PR] Willy Eddy, MD    The patient was evaluated in Emergency Department today for the symptoms described in the history of present illness. He/she was evaluated in the context of the global COVID-19 pandemic, which necessitated consideration that the patient might be at risk for infection with the SARS-CoV-2 virus that causes COVID-19. Institutional protocols and algorithms that pertain to the evaluation of patients at risk for COVID-19 are in a state of rapid change based on information released by regulatory bodies including the CDC and federal and state organizations. These policies and algorithms were followed during the patient's care in the ED.  As part of my medical decision making, I reviewed the following data within the electronic MEDICAL RECORD NUMBER Nursing notes reviewed and incorporated, Labs reviewed, notes from prior ED visits and [place] Controlled Substance Database   ____________________________________________   FINAL CLINICAL IMPRESSION(S) / ED DIAGNOSES  Final diagnoses:  Shortness of breath  Atypical chest pain  Aortic valve stenosis, etiology of cardiac valve disease unspecified      NEW MEDICATIONS STARTED DURING THIS VISIT:  New Prescriptions   No medications on file     Note:  This document was prepared using Dragon voice recognition software and may include unintentional dictation errors.    Willy Eddy, MD 02/26/19 [phone]

## 2019-02-26 NOTE — ED Triage Notes (Signed)
States for the past 3 to 4 days has had episodes of shortness of breath and chest tightness at night, worse when he walks.

## 2019-03-01 DIAGNOSIS — I350 Nonrheumatic aortic (valve) stenosis: Secondary | ICD-10-CM

## 2019-03-09 ENCOUNTER — Other Ambulatory Visit: Payer: Self-pay

## 2019-03-09 ENCOUNTER — Other Ambulatory Visit
Admission: RE | Admit: 2019-03-09 | Discharge: 2019-03-09 | Disposition: A | Discharge status: Home / Self Care | Payer: Medicare Other | Source: Ambulatory Visit | Attending: Internal Medicine | Admitting: Internal Medicine

## 2019-03-09 DIAGNOSIS — Z20828 Contact with and (suspected) exposure to other viral communicable diseases: Secondary | ICD-10-CM | POA: Insufficient documentation

## 2019-03-09 DIAGNOSIS — Z01812 Encounter for preprocedural laboratory examination: Secondary | ICD-10-CM | POA: Diagnosis present

## 2019-03-09 LAB — SARS CORONAVIRUS 2 (TAT 6-24 HRS): SARS Coronavirus 2: NEGATIVE

## 2019-03-14 ENCOUNTER — Ambulatory Visit
Admission: RE | Admit: 2019-03-14 | Discharge: 2019-03-14 | Disposition: A | Discharge status: Home / Self Care | Payer: Medicare Other | Attending: Internal Medicine | Admitting: Internal Medicine

## 2019-03-14 ENCOUNTER — Encounter
Admission: RE | Disposition: A | Discharge status: Home / Self Care | Payer: Self-pay | Source: Home / Self Care | Attending: Internal Medicine

## 2019-03-14 ENCOUNTER — Other Ambulatory Visit: Payer: Self-pay

## 2019-03-14 DIAGNOSIS — I25118 Atherosclerotic heart disease of native coronary artery with other forms of angina pectoris: Secondary | ICD-10-CM | POA: Diagnosis not present

## 2019-03-14 DIAGNOSIS — I350 Nonrheumatic aortic (valve) stenosis: Secondary | ICD-10-CM | POA: Diagnosis present

## 2019-03-14 DIAGNOSIS — R0602 Shortness of breath: Secondary | ICD-10-CM | POA: Insufficient documentation

## 2019-03-14 DIAGNOSIS — Z86711 Personal history of pulmonary embolism: Secondary | ICD-10-CM | POA: Diagnosis not present

## 2019-03-14 DIAGNOSIS — Z7901 Long term (current) use of anticoagulants: Secondary | ICD-10-CM | POA: Insufficient documentation

## 2019-03-14 DIAGNOSIS — F17200 Nicotine dependence, unspecified, uncomplicated: Secondary | ICD-10-CM | POA: Insufficient documentation

## 2019-03-14 DIAGNOSIS — I700 Atherosclerosis of aorta: Secondary | ICD-10-CM | POA: Insufficient documentation

## 2019-03-14 DIAGNOSIS — I10 Essential (primary) hypertension: Secondary | ICD-10-CM | POA: Diagnosis not present

## 2019-03-14 DIAGNOSIS — Z79899 Other long term (current) drug therapy: Secondary | ICD-10-CM | POA: Insufficient documentation

## 2019-03-14 DIAGNOSIS — I352 Nonrheumatic aortic (valve) stenosis with insufficiency: Secondary | ICD-10-CM | POA: Insufficient documentation

## 2019-03-14 HISTORY — PX: RIGHT/LEFT HEART CATH AND CORONARY ANGIOGRAPHY: CATH118266

## 2019-03-14 LAB — PROTIME-INR
INR: 1.5 — ABNORMAL HIGH (ref 0.8–1.2)
Prothrombin Time: 17.7 seconds — ABNORMAL HIGH (ref 11.4–15.2)

## 2019-03-14 SURGERY — RIGHT/LEFT HEART CATH AND CORONARY ANGIOGRAPHY
Anesthesia: Moderate Sedation | Laterality: Bilateral

## 2019-03-14 MED ORDER — FENTANYL CITRATE (PF) 100 MCG/2ML IJ SOLN
INTRAMUSCULAR | Status: AC
  Filled 2019-03-14: qty 2

## 2019-03-14 MED ORDER — SODIUM CHLORIDE 0.9 % WEIGHT BASED INFUSION: 3.0000 mL/kg/h | INTRAVENOUS | Status: AC

## 2019-03-14 MED ORDER — HEPARIN (PORCINE) IN NACL 1000-0.9 UT/500ML-% IV SOLN
INTRAVENOUS | Status: DC | PRN
  Administered 2019-03-14: 500 mL

## 2019-03-14 MED ORDER — ONDANSETRON HCL 4 MG/2ML IJ SOLN: 4.0000 mg | Freq: Four times a day (QID) | INTRAMUSCULAR | Status: DC | PRN

## 2019-03-14 MED ORDER — ASPIRIN 81 MG PO CHEW: 81.0000 mg | CHEWABLE_TABLET | ORAL | Status: DC

## 2019-03-14 MED ORDER — IOHEXOL 300 MG/ML  SOLN
INTRAMUSCULAR | Status: DC | PRN
  Administered 2019-03-14: 130 mL via INTRA_ARTERIAL

## 2019-03-14 MED ORDER — LABETALOL HCL 5 MG/ML IV SOLN
INTRAVENOUS | Status: DC | PRN
  Administered 2019-03-14: 20 mg via INTRAVENOUS

## 2019-03-14 MED ORDER — SODIUM CHLORIDE 0.9% FLUSH: 3.0000 mL | INTRAVENOUS | Status: DC | PRN

## 2019-03-14 MED ORDER — MIDAZOLAM HCL 2 MG/2ML IJ SOLN
INTRAMUSCULAR | Status: DC | PRN
  Administered 2019-03-14: 1 mg via INTRAVENOUS
  Administered 2019-03-14: 0.5 mg via INTRAVENOUS

## 2019-03-14 MED ORDER — SODIUM CHLORIDE 0.9 % WEIGHT BASED INFUSION: 1.0000 mL/kg/h | INTRAVENOUS | Status: DC

## 2019-03-14 MED ORDER — SODIUM CHLORIDE 0.9 % IV SOLN: 250.0000 mL | INTRAVENOUS | Status: DC | PRN

## 2019-03-14 MED ORDER — HEPARIN (PORCINE) IN NACL 1000-0.9 UT/500ML-% IV SOLN
INTRAVENOUS | Status: AC
  Filled 2019-03-14: qty 1000

## 2019-03-14 MED ORDER — ACETAMINOPHEN 325 MG PO TABS: 650.0000 mg | ORAL_TABLET | ORAL | Status: DC | PRN

## 2019-03-14 MED ORDER — FENTANYL CITRATE (PF) 100 MCG/2ML IJ SOLN
INTRAMUSCULAR | Status: DC | PRN
  Administered 2019-03-14: 50 ug via INTRAVENOUS
  Administered 2019-03-14: 25 ug via INTRAVENOUS

## 2019-03-14 MED ORDER — SODIUM CHLORIDE 0.9% FLUSH: 3.0000 mL | Freq: Two times a day (BID) | INTRAVENOUS | Status: DC

## 2019-03-14 MED ORDER — LABETALOL HCL 5 MG/ML IV SOLN: 10.0000 mg | INTRAVENOUS | Status: DC | PRN

## 2019-03-14 MED ORDER — HYDRALAZINE HCL 20 MG/ML IJ SOLN: 10.0000 mg | INTRAMUSCULAR | Status: DC | PRN

## 2019-03-14 MED ORDER — MIDAZOLAM HCL 2 MG/2ML IJ SOLN
INTRAMUSCULAR | Status: AC
  Filled 2019-03-14: qty 2

## 2019-03-14 MED ORDER — SODIUM CHLORIDE 0.9 % WEIGHT BASED INFUSION
1.0000 mL/kg/h | INTRAVENOUS | Status: DC
  Administered 2019-03-14: 07:00:00 1 mL/kg/h via INTRAVENOUS

## 2019-03-14 MED ORDER — LABETALOL HCL 5 MG/ML IV SOLN
INTRAVENOUS | Status: AC
  Filled 2019-03-14: qty 4

## 2019-03-14 SURGICAL SUPPLY — 12 items
CATH INFINITI 5FR ANG PIGTAIL (CATHETERS) ×3 IMPLANT
CATH INFINITI 5FR JL4 (CATHETERS) ×3 IMPLANT
CATH INFINITI JR4 5F (CATHETERS) ×3 IMPLANT
CATH SWANZ 7F THERMO (CATHETERS) ×3 IMPLANT
KIT MANI 3VAL PERCEP (MISCELLANEOUS) ×3 IMPLANT
KIT RIGHT HEART (MISCELLANEOUS) ×3 IMPLANT
NEEDLE PERC 18GX7CM (NEEDLE) ×3 IMPLANT
PACK CARDIAC CATH (CUSTOM PROCEDURE TRAY) ×3 IMPLANT
SHEATH AVANTI 6FR X 11CM (SHEATH) ×3 IMPLANT
SHEATH AVANTI 7FRX11 (SHEATH) ×3 IMPLANT
WIRE EMERALD 3MM-J .035X260CM (WIRE) ×3 IMPLANT
WIRE GUIDERIGHT .035X150 (WIRE) ×3 IMPLANT

## 2019-07-27 ENCOUNTER — Other Ambulatory Visit: Payer: Self-pay

## 2019-07-27 ENCOUNTER — Emergency Department
Admission: EM | Admit: 2019-07-27 | Discharge: 2019-07-27 | Disposition: A | Discharge status: Home / Self Care | Payer: Medicare Other | Attending: Student | Admitting: Student

## 2019-07-27 ENCOUNTER — Encounter: Payer: Self-pay | Admitting: Emergency Medicine

## 2019-07-27 DIAGNOSIS — F17200 Nicotine dependence, unspecified, uncomplicated: Secondary | ICD-10-CM | POA: Insufficient documentation

## 2019-07-27 DIAGNOSIS — Z7901 Long term (current) use of anticoagulants: Secondary | ICD-10-CM | POA: Insufficient documentation

## 2019-07-27 DIAGNOSIS — Z79899 Other long term (current) drug therapy: Secondary | ICD-10-CM | POA: Insufficient documentation

## 2019-07-27 DIAGNOSIS — R791 Abnormal coagulation profile: Secondary | ICD-10-CM | POA: Diagnosis not present

## 2019-07-27 LAB — PROTIME-INR
INR: 4.9 (ref 0.8–1.2)
Prothrombin Time: 45.4 seconds — ABNORMAL HIGH (ref 11.4–15.2)

## 2019-07-27 MED ORDER — PHYTONADIONE 5 MG PO TABS
2.5000 mg | ORAL_TABLET | Freq: Once | ORAL | Status: AC
  Administered 2019-07-27: 16:00:00 2.5 mg via ORAL
  Filled 2019-07-27: qty 1

## 2019-07-27 MED ORDER — VITAMIN K1 1 MG/0.5ML IJ SOLN: 1.0000 mg | Freq: Once | INTRAMUSCULAR | Status: DC

## 2019-07-27 NOTE — ED Provider Notes (Signed)
[facility] Emergency Department Provider Note  ____________________________________________   First MD Initiated Contact with Patient 07/27/19 1514     (approximate)  I have reviewed the triage vital signs and the nursing notes.   HISTORY  Chief Complaint Injections    HPI James Shepherd is a 59 y.o. male presents emergency department saying his doctor sent him here for vitamin K.  States his INR is elevated.  No other complaints at this time.  Review of systems is negative    Past Medical History:  Diagnosis Date  . Aortic stenosis   . Arthritis   . GI bleeding   . PE (pulmonary thromboembolism) (HCC)   . Thrombus of atrial appendage     Patient Active Problem List   Diagnosis Date Noted  . Aortic stenosis 03/01/2019  . GI bleed 01/27/2018    Past Surgical History:  Procedure Laterality Date  . COLONOSCOPY WITH PROPOFOL N/A 01/28/2018   Procedure: COLONOSCOPY WITH PROPOFOL;  Surgeon: Wyline Mood, MD;  Location: [facility] ENDOSCOPY;  Service: Gastroenterology;  Laterality: N/A;  . NO PAST SURGERIES    . RIGHT/LEFT HEART CATH AND CORONARY ANGIOGRAPHY Bilateral 03/14/2019   Procedure: RIGHT/LEFT HEART CATH AND CORONARY ANGIOGRAPHY;  Surgeon: Lamar Blinks, MD;  Location: [facility] INVASIVE CV LAB;  Service: Cardiovascular;  Laterality: Bilateral;    Prior to Admission medications   Medication Sig Start Date End Date Taking? Authorizing Provider  amLODipine (NORVASC) 5 MG tablet Take 5 mg by mouth daily.    [provider]  furosemide (LASIX) 20 MG tablet Take 20 mg by mouth.    [provider]  pantoprazole (PROTONIX) 40 MG tablet Take 40 mg by mouth 2 (two) times daily.    [provider]  warfarin (COUMADIN) 10 MG tablet Take 10 mg by mouth daily.    [provider]    Allergies Patient has no known allergies.  Family History  Problem Relation Age of Onset  . CAD Mother     Social History Social History     Tobacco Use  . Smoking status: Current Every Day Smoker  . Smokeless tobacco: Never Used  Substance Use Topics  . Alcohol use: Yes    Comment: 2 beers per day  . Drug use: Never    Review of Systems  Constitutional: No fever/chills Eyes: No visual changes. ENT: No sore throat. Respiratory: Denies cough Cardiovascular: Denies chest pain Gastrointestinal: Denies abdominal pain Genitourinary: Negative for dysuria. Musculoskeletal: Negative for back pain. Skin: Negative for rash. Psychiatric: no mood changes,     ____________________________________________   PHYSICAL EXAM:  VITAL SIGNS: ED Triage Vitals  Enc Vitals Group     BP 07/27/19 1356 (!) 182/71     Pulse Rate 07/27/19 1356 (!) 104     Resp 07/27/19 1356 18     Temp 07/27/19 1356 99 F (37.2 C)     Temp Source 07/27/19 1356 Oral     SpO2 07/27/19 1356 99 %     Weight 07/27/19 1342 175 lb 0.7 oz (79.4 kg)     Height --      Head Circumference --      Peak Flow --      Pain Score 07/27/19 1342 0     Pain Loc --      Pain Edu? --      Excl. in GC? --     Constitutional: Alert and oriented. Well appearing and in no acute distress. Eyes: Conjunctivae are  normal.  Head: Atraumatic. Nose: No congestion/rhinnorhea. Mouth/Throat: Mucous membranes are moist.   Neck:  supple no lymphadenopathy noted Cardiovascular: Normal rate, regular rhythm. Respiratory: Normal respiratory effort.  No retractions,  GU: deferred Musculoskeletal: FROM all extremities, warm and well perfused Neurologic:  Normal speech and language.  Skin:  Skin is warm, dry and intact.  No bruising noted on his skin no rash noted. Psychiatric: Mood and affect are normal. Speech and behavior are normal.  ____________________________________________   LABS (all labs ordered are listed, but only abnormal results are displayed)  Labs Reviewed  PROTIME-INR - Abnormal; Notable for the following components:      Result Value   Prothrombin  Time 45.4 (*)    INR 4.9 (*)    All other components within normal limits   ____________________________________________   ____________________________________________  RADIOLOGY    ____________________________________________   PROCEDURES  Procedure(s) performed: Vitamin K 2.5 p.o.   Procedures    ____________________________________________   INITIAL IMPRESSION / ASSESSMENT AND PLAN / ED COURSE  Pertinent labs & imaging results that were available during my care of the patient were reviewed by me and considered in my medical decision making (see chart for details).   Patient is a 59 year old male presents emergency department for a vitamin K pill/shot.  States INR was elevated.  Physical exam shows no abnormality.  INR is elevated at 4.5  Discussed with Dr. Joan Mayans and the pharmacist.  We all agreed that p.o. vitamin K would be more than appropriate at this time.  Patient was given vitamin K 2.5 p.o.  He was discharged stable condition.  He is instructed to follow-up with his doctor on Monday as previously indicated.  He is to stop taking his Coumadin until seen by his doctor on Monday.  States he understands will comply.  Is discharged stable condition.    James Shepherd was evaluated in Emergency Department on 07/27/2019 for the symptoms described in the history of present illness. He was evaluated in the context of the global COVID-19 pandemic, which necessitated consideration that the patient might be at risk for infection with the SARS-CoV-2 virus that causes COVID-19. Institutional protocols and algorithms that pertain to the evaluation of patients at risk for COVID-19 are in a state of rapid change based on information released by regulatory bodies including the CDC and federal and state organizations. These policies and algorithms were followed during the patient's care in the ED.   As part of my medical decision making, I reviewed the following data within the  [place] notes reviewed and incorporated, Labs reviewed INR elevated, Old chart reviewed, Notes from prior ED visits and [place] Controlled Substance Database  ____________________________________________   FINAL CLINICAL IMPRESSION(S) / ED DIAGNOSES  Final diagnoses:  Elevated INR      NEW MEDICATIONS STARTED DURING THIS VISIT:  New Prescriptions   No medications on file     Note:  This document was prepared using Dragon voice recognition software and may include unintentional dictation errors.    Versie Starks, PA-C 07/27/19 1557    Lilia Pro., MD 07/28/19 1240

## 2019-07-27 NOTE — ED Triage Notes (Signed)
First Nurse Note:  ARrives for vitamin K injection.  States INR is elevated and PCP tried to write RX for vitamin K and then for him to follow up with PCP on Monday.  States no pharmacies in the area have vitamin K in stock.  Patient is AAOx3.  Skin warm and dry. NAD

## 2019-07-27 NOTE — Discharge Instructions (Addendum)
Follow-up with your regular doctor on Monday as earlier indicated.  Return emergency department if worsening.  If you begin to bleed.  Stop taking your Coumadin until you see your doctor.

## 2021-01-28 IMAGING — CR DG CHEST 2V
2 series · 2 of 2 positions shown · non-contrast
Comparison: None.

CLINICAL DATA: Shortness of breath and chest tightness the past 5
mornings.

EXAM:
CHEST - 2 VIEW

[chest pa]
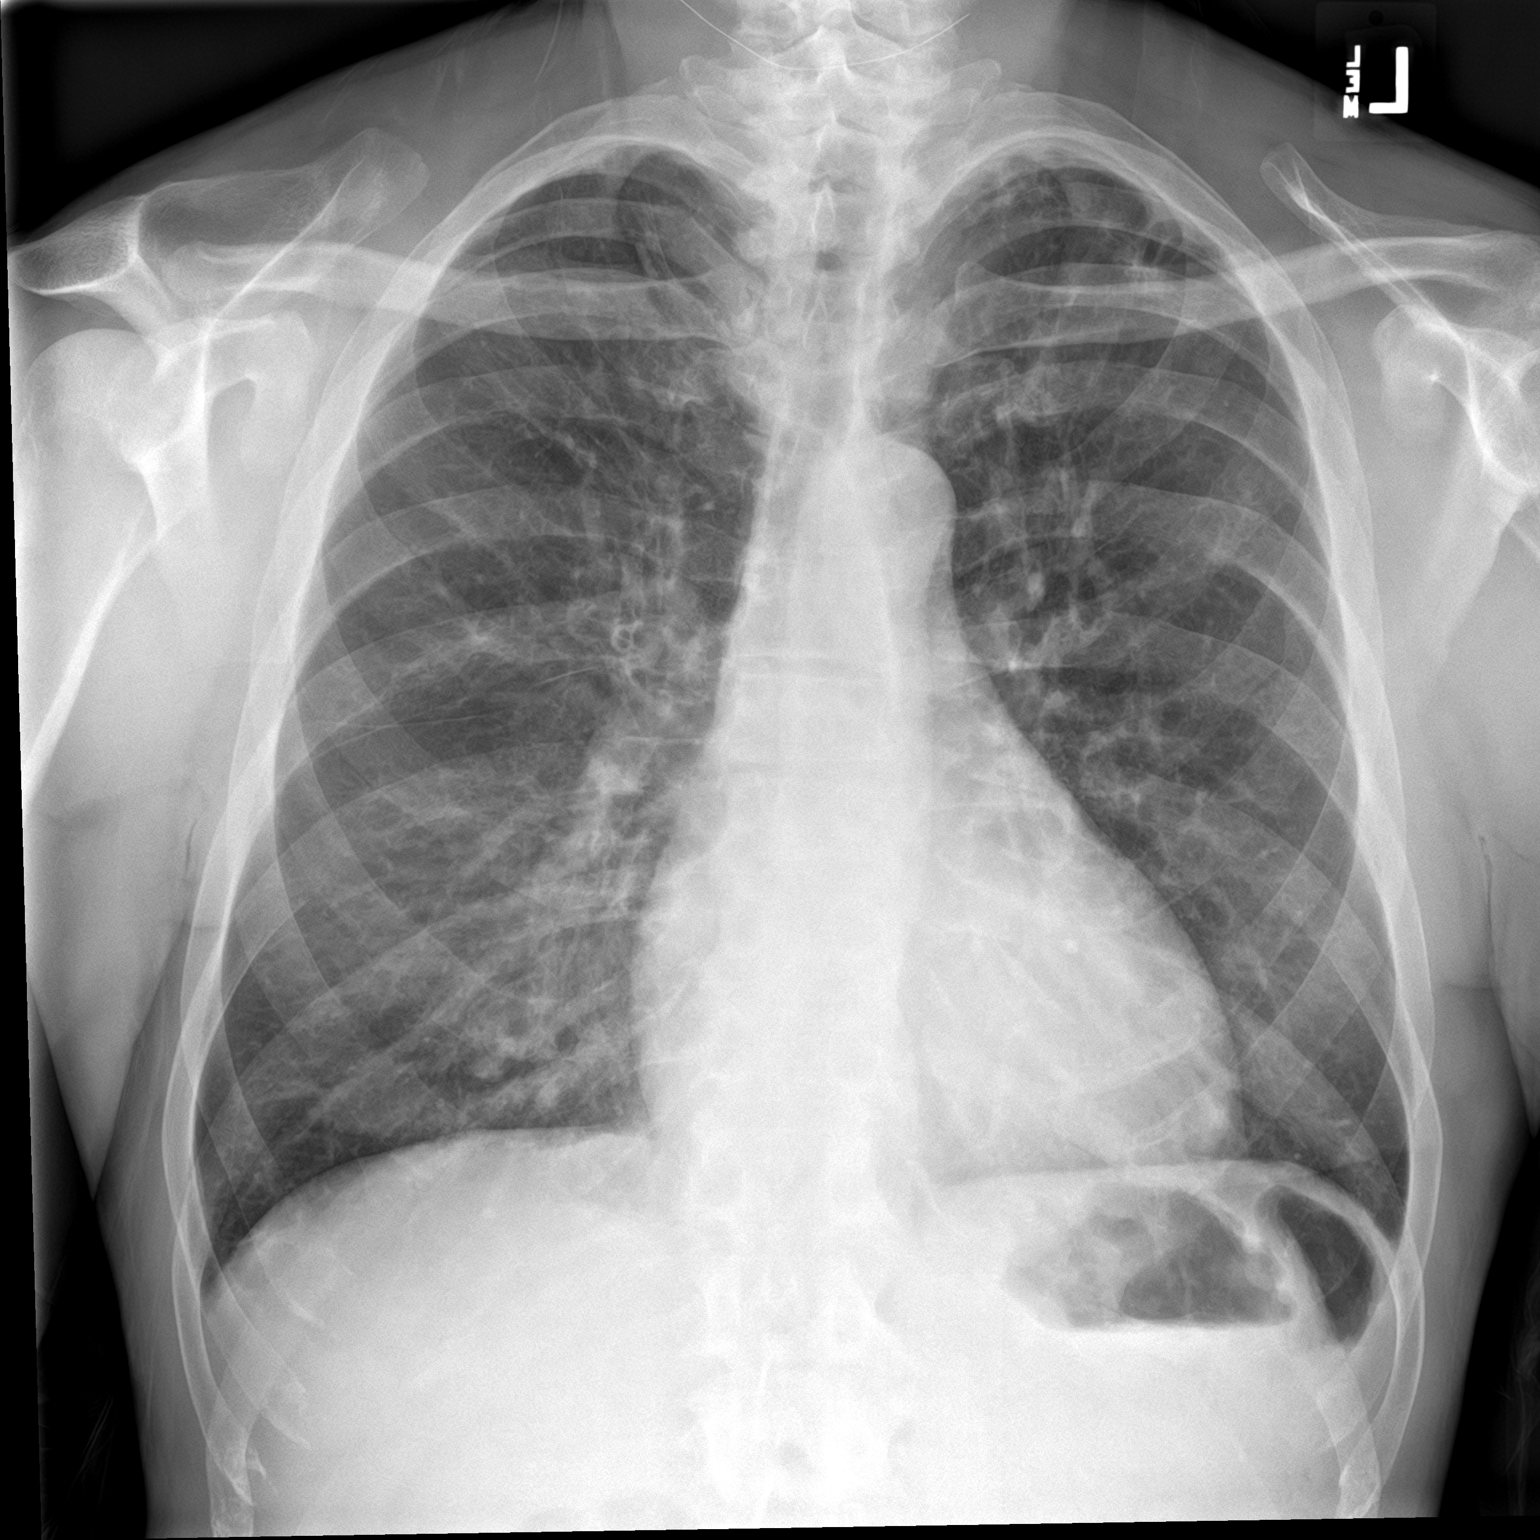

[chest lat]
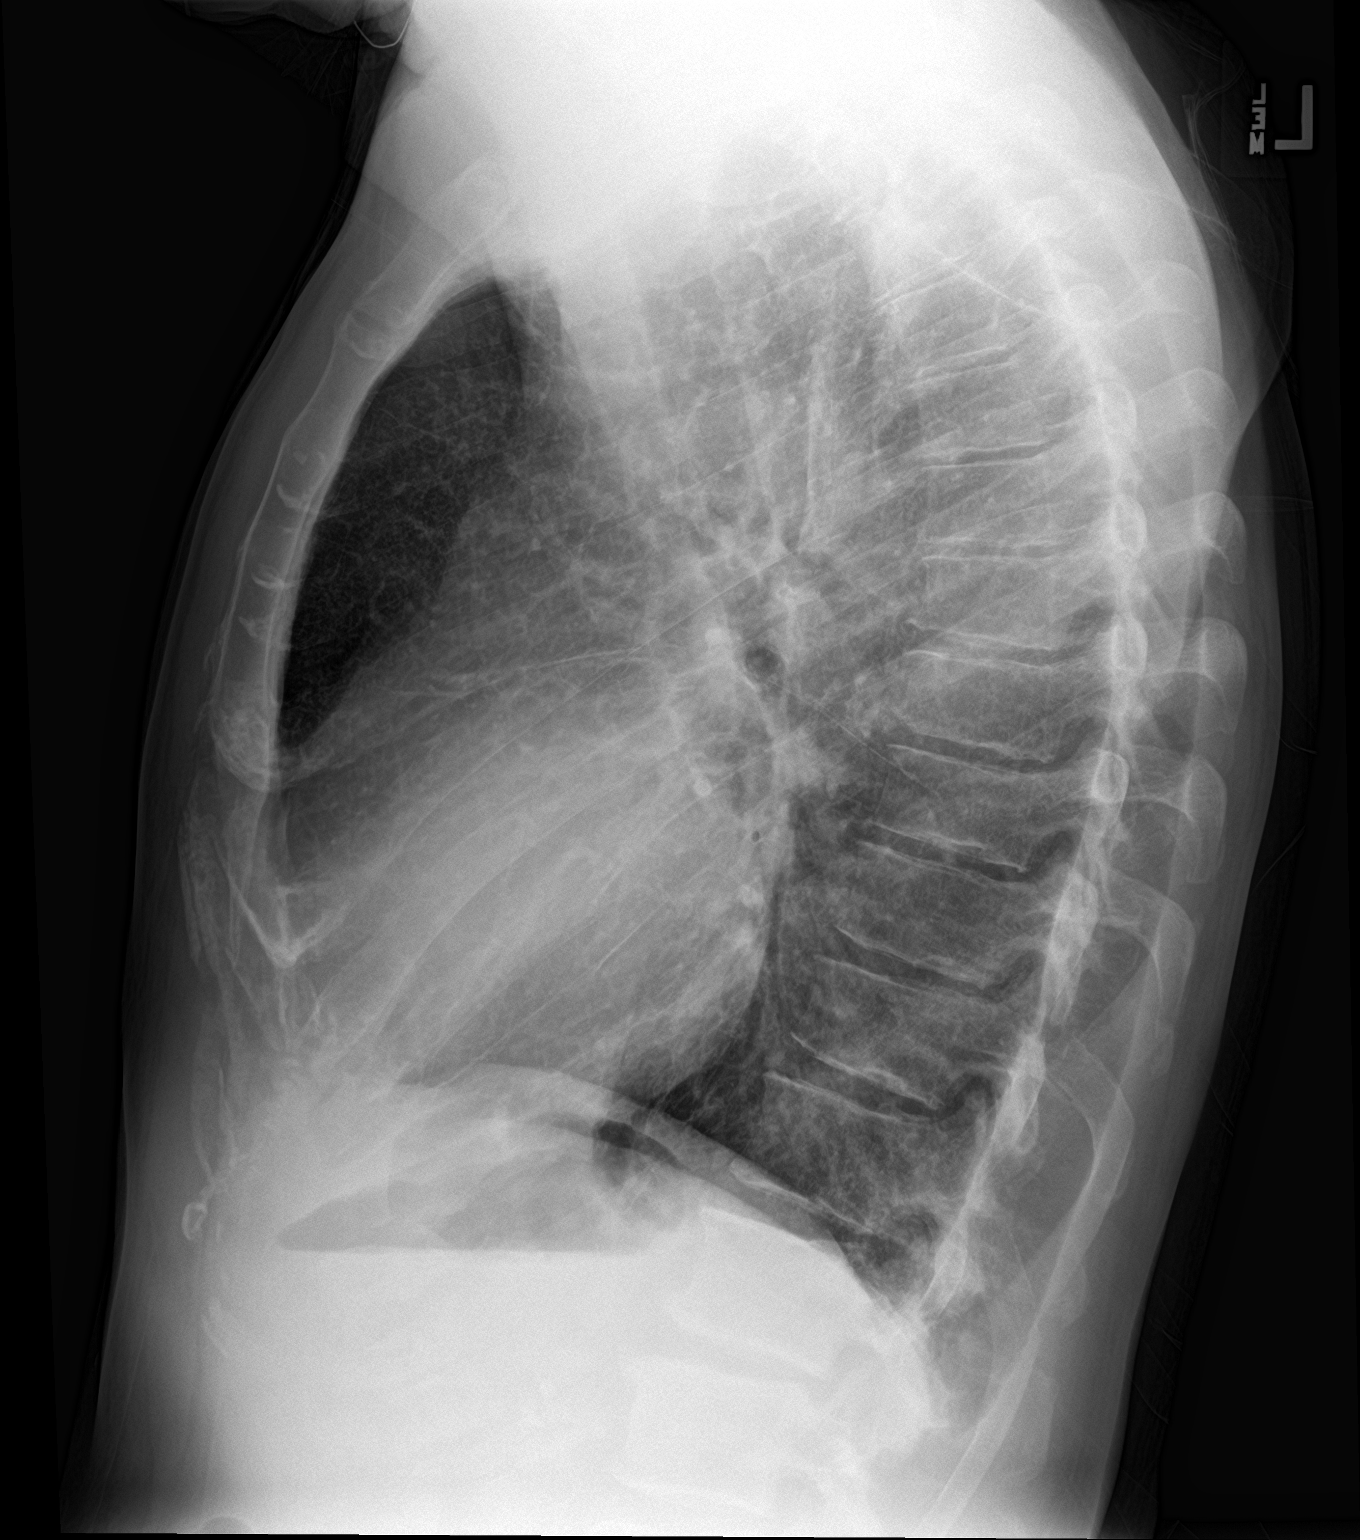

[2 of 2 positions shown; findings below may reference images not displayed]

FINDINGS: The lungs are emphysematous. A nodular opacity in the left lung apex
is noted. No consolidative process, pneumothorax or effusion. Heart
size is normal. No acute or focal bony abnormality.
IMPRESSION: The lungs are markedly emphysematous. Small nodular opacity in left
lung apex is likely scar but nonemergent chest CT with contrast is
recommended for further evaluation.

No acute disease.

## 2021-09-15 NOTE — Progress Notes (Signed)
 Established Patient Visit   Chief Complaint: Chief Complaint  Patient presents with  . Coronary Artery Disease  . Hypertension    Echo follow up  . Bradycardia   Date of Service: 09/15/2021 Date of Birth: 1961/05/29 PCP: Buren Rock Browning, MD  History of Present Illness: James Shepherd is a 61 y.o.male patient  Hypertension The patient has had a diagnosis of essential hypertension for which they have received appropriate medical therapy.  There has been a recent change in medication management to try and improve overall long-term risk reduction and blood pressure control with some success since last visit. They will continue this medication regimen. They have been advised to follow blood pressure with a home blood pressure cuff if able.  They also have been advised to follow appropriate treatment plans and lifestyle measures to achieve the optimal blood pressure goals. Coronary Artery Disease The patient has had a diagnosis of coronary artery disease s/p cardiac cath years ago with current treatment including beta blocker, ca++blocker and statin without apparent medication side effects.  These medications and treatment are helping to modify current risk factors including hypertension, coronary artery disease and peripheral vascular disease. There has been no current evidence of progression of angina and/or anginal equivalent with treatment listed above. Aortic Valve Stenosis The patient has a diagnosis of moderate non rheumatic aortic valve stenosis currently with a significant murmur. The patient has had symptoms including dyspnea stable. The patient has had a recent evaluation of the severity the stenosis and need for other treatment options. There has not been a recent change in murmur. There has been a discussion of the future concerns with aortic stenosis progression and need for continued clinical and echocardiographic evaluation with management. Bradycardia The patient has chronic  bradycardia.  The patient currently has no evidence of significant symptomatic bradycardia or heart block.  Additionally, the patient has not had any current concerns of significant symptoms requiring further intervention at this time.  We have discussed future concerns of cardiovascular disease, sick sinus syndrome, symptoms, medications, and other medication interactions which may exacerbate above. The patient understands the above discussion  Hyperlipidemia The patient does have hyperlipidemia on Moderate intensity therapy with pravastatin  (Pravachol ). The lipid levels appear to be stable at this time without apparent significant side effects of medications. There patient currently has knowledge of the continued goals of treatment to reduce cardiovascular risk and/or complication.  Reasons for risk reduction and further prevention measures include coronary artery disease, vascular disease, > 7.5% 10 year cardiovascular risk score and high LDL  Diet and Mortality We have had a discussion about dieting and the quality of foods.  It has been observed that there are significant diet considerations from the data collected from 37,233 American adults collected by the [facility] health and nutrition examination survey during 1999 through 2014.  It does appear that there are health consequences on the specific types of carbohydrates and fats that are consumed rather than from whether the patient chooses to use high or low carbohydrate or high or low fat diet.  It appears more important where the carbohydrates and fat come from.  According to this analysis higher mortality rates have been linked to unhealthy low carbohydrate and unhealthy low-fat diets.  There is a reinforcement of the concept that its the quality of the fat and carbohydrate a person eats that matters for health, rather than the relative levels of these nutrients.  Eating unsaturated fats and unprocessed carbohydrates like whole grains, fruits, and  legumes  produce the greatest health and survival, while higher levels of saturated fats and processed carbohydrates in the diet produce health problems.  Therefore, we have suggested that the patient stick with the food principles advanced by the American Heart Association diet, the DASH diet, and the Mediterranean diet.       Results for orders placed or performed in visit on 09/10/21  Echo complete  Result Value Ref Range   LV Ejection Fraction (%) 55    Aortic Valve Regurgitation Grade mild    Aortic Valve Stenosis Grade moderate    Aortic Valve Max Velocity (m/s) 3.6 m/sec   Aortic Valve Stenosis Mean Gradient (mmHg) 19.2 mmHg   Mitral Valve Regurgitation Grade mild    Mitral Valve Stenosis Grade none    Tricuspid Valve Regurgitation Grade mild    Tricuspid Valve Regurgitation Max Velocity (m/s) 3.1 m/sec   Right Ventricle Systolic Pressure (mmHg) 41.5 mmHg   LV End Diastolic Diameter (cm) 6.1 cm   LV End Systolic Diameter (cm) 4.7 cm   LV Septum Wall Thickness (cm) 1.3 cm   LV Posterior Wall Thickness (cm) 1.3 cm   Left Atrium Diameter (cm) 4.6 cm   Narrative                           CARDIOLOGY DEPARTMENT                EDMON, MAGID           [facility]                                    [id number]           [facility]                           Acct #: [id number]           [address] [place], [place] [address]       Date: 09/10/2021 11: 13 AM                                                              Adult   Male   Age: 1 yrs           ECHOCARDIOGRAM REPORT                              Outpatient                                                              KC^^KCWC      STUDY:CHEST WALL               TAPE:0000: 00: 0: 00: 00 MD1: TOBIN, AMANDA       ECHO:Yes    DOPPLER:Yes       FILE:0000-000-000        BP: 128/68 mmHg  COLOR:Yes   CONTRAST:No     MACHINE:Philips  RV BIOPSY:No          3D:No  SOUND QLTY:Moderate            Height: 71 in      MEDIUM:None                                              Weight: 165 lb                                                              BSA: 1.9 m2 _________________________________________________________________________________________               HISTORY: Cardiomyopathy, Valvular disease                REASON: Assess, LV function, Assess, Aortic Stenosis            INDICATION: I35.0 Nonrheumatic aortic (valve) stenosis, I42.0 Dilated                        cardiomyopathy _________________________________________________________________________________________ ECHOCARDIOGRAPHIC MEASUREMENTS 2D DIMENSIONS AORTA                  Values   Normal Range   MAIN PA         Values    Normal Range               Annulus: nm*          [2.3-2.9]         PA Main: nm*       [1.5-2.1]             Aorta Sin: 3.5 cm       [3.1-3.7]    RIGHT VENTRICLE           ST Junction: 2.8 cm       [2.6-3.2]         RV Base: 4.2 cm    [< 4.2]             Asc.Aorta: 3.1 cm       [2.6-3.4]          RV Mid: nm*       [<3.5] LEFT VENTRICLE                                      RV Length: nm*       [<8.6]                 LVIDd: 6.1 cm       [4.2-5.9]    INFERIOR VENA CAVA                 LVIDs: 4.7 cm                        Max. IVC: nm*       [<=2.1]                    FS: 22.9 %       [>25]  Min. IVC: nm*                   SWT: 1.3 cm       [0.6-1.0]    ------------------                   PWT: 1.3 cm       [0.6-1.0]    nm* - not measured LEFT ATRIUM               LA Diam: 4.6 cm       [3.0-4.0]           LA A4C Area: 21.5 cm2     [< 20]             LA Volume: 84.5 ml      [18-58] _________________________________________________________________________________________ ECHOCARDIOGRAPHIC DESCRIPTIONS AORTIC ROOT                  Size: Normal            Dissection: INDETERM FOR DISSECTION AORTIC VALVE              Leaflets: Tricuspid                   Morphology: MODERATELY THICKENED              Mobility:  PARTIALLY MOBILE LEFT VENTRICLE                  Size: MILDLY ENLARGED               Anterior: Normal           Contraction: Normal                         Lateral: Normal            Closest EF: >55% (Estimated)                Septal: Normal              Calc. EF: 55% (2D-MOD)                    Apical: Normal             LV Masses: No Masses                     Inferior: Normal                   LVH: MILD LVH CONCENTRIC          Posterior: Normal          Dias.FxClass: (Grade 1) relaxation abnormal, E/A reversal MITRAL VALVE              Leaflets: Normal                        Mobility: Fully mobile            Morphology: Normal LEFT ATRIUM                  Size: MILDLY ENLARGED              LA Masses: No masses             IA Septum: Normal IAS MAIN PA                  Size: Normal PULMONIC  VALVE            Morphology: Normal                        Mobility: Fully mobile RIGHT VENTRICLE                  Size: MILDLY ENLARGED              Free Wall: Normal           Contraction: Normal                       RV Masses: No Masses TRICUSPID VALVE              Leaflets: Normal                        Mobility: Fully mobile            Morphology: Normal RIGHT ATRIUM                  Size: MILDLY ENLARGED               RA Other: None               RA Mass: No masses PERICARDIUM                 Fluid: No effusion INFERIOR VENACAVA                  Size: Normal Normal respiratory collapse _________________________________________________________________________________________  DOPPLER ECHO and OTHER SPECIAL PROCEDURES                Aortic: MILD AR                    MODERATE AS                        357.8 cm/sec peak vel      51.2 mmHg peak grad                        19.2 mmHg mean grad        1.4 cm^2 by DOPPLER                Mitral: MILD MR                    No MS                        MV Inflow E Vel = 60.4 cm/sec     MV Annulus E'Vel = 6.3 cm/sec                         E/E'Ratio = 9.6             Tricuspid: MILD TR                    No TS                        310.1 cm/sec peak TR vel   41.5 mmHg peak RV pressure             Pulmonary: TRIVIAL PR  No PS _________________________________________________________________________________________ INTERPRETATION NORMAL LEFT VENTRICULAR SYSTOLIC FUNCTION NORMAL RIGHT VENTRICULAR SYSTOLIC FUNCTION MILD VALVULAR REGURGITATION (See above) MODERATE VALVULAR STENOSIS (See above) MILD LV ENLARGEMENT MILD RV ENLARGEMENT MILD BIATRIAL ENLARGEMENT _________________________________________________________________________________________ Electronically signed by      MD Wolm Rhyme on 09/14/2021 09: 18 AM          Performed By: Gladis Cough, RDCS, RVT    Ordering Physician: James Shepherd _________________________________________________________________________________________     Past Medical and Surgical History  Past Medical History Past Medical History:  Diagnosis Date  . Pulmonary emboli (CMS-HCC)     Past Surgical History He has a past surgical history that includes esophagogastrodoudenoscopy w/biopsy (N/A, 01/24/2018).   Medications and Allergies  Current Medications  Current Outpatient Medications on File Prior to Visit  Medication Sig Dispense Refill  . amLODIPine  (NORVASC ) 5 MG tablet Take 1 tablet (5 mg total) by mouth once daily 90 tablet 3  . carvediloL  (COREG ) 3.125 MG tablet Take 1 tablet (3.125 mg total) by mouth 2 (two) times daily with meals 180 tablet 3  . FUROsemide  (LASIX ) 40 MG tablet Take 1 tablet (40 mg total) by mouth once daily 90 tablet 3  . oxyCODONE  (ROXICODONE ) 5 MG immediate release tablet     . pravastatin  (PRAVACHOL ) 40 MG tablet Take 1 tablet (40 mg total) by mouth at bedtime 90 tablet 3  . warfarin (COUMADIN ) 1 MG tablet Take as directed    . warfarin (COUMADIN ) 5 MG tablet Take 5 mg by mouth nightly        No current facility-administered  medications on file prior to visit.    Allergies: Aspirin , Tylenol  [acetaminophen ], and Warfarin  Social and Family History  Social History  reports that he has been smoking. He has never used smokeless tobacco. He reports that he does not currently use alcohol. He reports that he does not currently use drugs.  Family History Family History  Problem Relation Age of Onset  . Coronary Artery Disease (Blocked arteries around heart) Mother     Review of Systems   Review of Systems  Positive for sob  Negative for weight gain weight loss, weakness, vision change, hearing loss, cough, congestion, PND, orthopnea, heartburn, nausea, diaphoresis, vomiting, diarrhea, melena, stomach pain, extremity pain, leg weakness, leg cramping, leg blood clots, headache, blackouts, nosebleed, trouble swallowing, mouth pain, urinary frequency, urination at night, muscle weakness, skin lesions, skin rashes, tingling ,ulcers, numbness, anxiety, and/or depression Physical Examination   Vitals:BP 130/64   Pulse 54   Ht 182.9 cm (6')   Wt 75.9 kg (167 lb 6.4 oz)   SpO2 97%   BMI 22.70 kg/m  Ht:182.9 cm (6') Wt:75.9 kg (167 lb 6.4 oz) ADJ:Anib surface area is 1.96 meters squared. Body mass index is 22.7 kg/m. Appearance: well appearing in no acute distress HEENT: Pupils equally reactive to light and accomodation, no xanthalasma   Neck: Supple, no apparent thyromegaly, masses, or lymphadenopathy  Lungs: normal respiratory effort; no crackles, no rhonchi, no wheezes Heart: Regular rate and rhythm. Normal S1 S2  No gallops, 3+rusb murmur, no rub, PMI is normal size and placement. carotid upstroke normal with bruit. Jugular venous pressure is normal Abdomen: soft, nontender, not distended with normal bowel sounds. No apparent hepatosplenomegally. Abdominal aorta is normal size without bruit Extremities: no edema, no ulcers, no clubbing, no cyanosis Peripheral Pulses: 2+ in upper extremities, 2+ femoral pulses  bilaterally, 2+lower extremity  Musculoskeletal;  Normal muscle tone without kyphosis Neurological:   Oriented and Alert, Cranial  nerves intact  Assessment   61 y.o. male with  Encounter Diagnoses  Name Primary?  . Benign essential HTN Yes  . Coronary artery disease involving native coronary artery of native heart without angina pectoris   . Moderate aortic valve stenosis         Plan  -Continue current medical regimen for hypertension control which is stable at this time and without apparent significant side effects or symptoms of medications.  Further treatment goals of low sodium diet for additive effects of these medications have been discussed today as well. -The patient understands the risks and benefits of medication management for cardiovascular risk factors including lipid management.  We plan on continuing to reduce cardiovascular risk and cardiovascular disease process by lowering LDL between 30% and 50% if achievable.  We have discussed other lifestyle measures to help with this risk reduction as well. -The patient understands all risks of future cardiovascular disease process and possible signs or symptoms of progression based on discussion today.  We will continue all risk factor modification and prevention measures  including lipid management, exercise and diet, blood pressure control, and anti-platelet medication management as tolerated. -Continue to follow for any significant aortic valve stenosis symptoms including shortness of breath, chest pain, syncope, and/or heart failure.  The patient will continue regular physical activity and monitor for these symptoms listed above.  We will plan on follow-up and further echocardiographic and/or functional assessment in the future as needed depending on symptoms and activity levels.  The patient currently understands the pathophysiology of aortic stenosis. -The patient has been instructed to adhere to improving healthy lifestyle.  We  have specifically addressed the most important factors including abstinence of tobacco use, regular physical activity including the possibility of exercise prescription, healthy diet, and maintaining an appropriate body mass index.    No orders of the defined types were placed in this encounter.   Return in about 6 months (around 03/17/2022).     WOLM GORDY RHYME, MD

## 2022-06-09 ENCOUNTER — Ambulatory Visit: Payer: Medicare Other | Admitting: Podiatry

## 2022-06-21 ENCOUNTER — Ambulatory Visit: Payer: Medicare Other | Admitting: Podiatry

## 2023-08-24 ENCOUNTER — Other Ambulatory Visit: Payer: Self-pay | Admitting: Student

## 2023-08-24 DIAGNOSIS — I2510 Atherosclerotic heart disease of native coronary artery without angina pectoris: Secondary | ICD-10-CM

## 2023-08-24 DIAGNOSIS — R9439 Abnormal result of other cardiovascular function study: Secondary | ICD-10-CM

## 2023-09-14 ENCOUNTER — Telehealth (HOSPITAL_COMMUNITY): Payer: Self-pay | Admitting: *Deleted

## 2023-09-14 NOTE — Telephone Encounter (Signed)
Attempted to call patient regarding upcoming cardiac CT appointment. VM not set up to leave a message.  Larey Brick RN Navigator Cardiac Imaging [facility] [phone] Office [phone] Cell

## 2023-09-15 ENCOUNTER — Ambulatory Visit: Admission: RE | Admit: 2023-09-15 | Source: Ambulatory Visit

## 2023-10-05 ENCOUNTER — Telehealth (HOSPITAL_COMMUNITY): Payer: Self-pay | Admitting: Emergency Medicine

## 2023-10-05 NOTE — Telephone Encounter (Signed)
 Unable to leave vm Rockwell Alexandria RN Navigator Cardiac Imaging [facility] Services [phone] Office  [phone] Cell

## 2023-10-06 ENCOUNTER — Ambulatory Visit: Admission: RE | Admit: 2023-10-06 | Source: Ambulatory Visit

## 2023-10-17 NOTE — Progress Notes (Signed)
 Established Patient Visit   Chief Complaint: No chief complaint on file.  Date of Service: 10/17/2023 Date of Birth: 09/16/1960 PCP: Buren Rock Browning, MD  History of Present Illness: Mr. Saetern is a 63 y.o.male patient with a past medical history of moderate aortic valve stenosis, coronary artery disease moderate by cath in 2020, dilated cardiomyopathy, history of pulmonary embolism, hypertension, bradycardia.   Patient is here today for 3 month follow-up.  He recently has had more leg edema, although improved with switch to torsemide 20 mg.  He has been having some cramping with torsemide and would like to switch back to furosemide .  Legs are at baseline at this time.  Denies any shortness of breath, chest pain, weakness, palpitations, syncope.  Lexiscan 08/16/23 showed moderate intensity partially reversible defect in basal to apical inferior wall suggestive of possible ischemia with prior infarction in RCA territory.  Patient asymptomatic at this time. CT heart angiogram was ordered but not completed. Patient feels asymptomatic at this time  He has had recent echocardiogram 02/18/2023 which had showed normal LV systolic function EF greater than 55% with mild-moderate aortic stenosis unchanged when compared to previous echo in 2023.   Past Medical and Surgical History  Past Medical History Past Medical History:  Diagnosis Date  . Pulmonary emboli (CMS/HHS-HCC)     Past Surgical History He has a past surgical history that includes esophagogastrodoudenoscopy w/biopsy (N/A, 01/24/2018).   Medications and Allergies  Current Medications  Current Outpatient Medications on File Prior to Visit  Medication Sig Dispense Refill  . amLODIPine  (NORVASC ) 5 MG tablet Take 1 tablet (5 mg total) by mouth once daily 90 tablet 3  . carvediloL  (COREG ) 3.125 MG tablet Take 1 tablet (3.125 mg total) by mouth 2 (two) times daily with meals 180 tablet 3  . oxyCODONE  (ROXICODONE ) 5 MG immediate release  tablet Take 5 mg by mouth as needed    . pravastatin  (PRAVACHOL ) 40 MG tablet Take 1 tablet (40 mg total) by mouth at bedtime 90 tablet 3  . warfarin (COUMADIN ) 1 MG tablet Take as directed    . warfarin (COUMADIN ) 5 MG tablet Take 5 mg by mouth nightly        No current facility-administered medications on file prior to visit.    Allergies: Aspirin , Tylenol  [acetaminophen ], and Warfarin  Social and Family History  Social History  reports that he has been smoking. He has never used smokeless tobacco. He reports that he does not currently use alcohol. He reports that he does not currently use drugs.  Family History Family History  Problem Relation Name Age of Onset  . Coronary Artery Disease (Blocked arteries around heart) Mother      Review of Systems   Review of Systems  Positive for none  Negative for weight gain weight loss, weakness, vision change, hearing loss, cough, congestion, PND, orthopnea, heartburn, nausea, diaphoresis, vomiting, diarrhea, bloody stool, melena, stomach pain, extremity pain, leg weakness, leg cramping, leg blood clots, headache, blackouts, nosebleed, trouble swallowing, mouth pain, urinary frequency, urination at night, muscle weakness, skin lesions, skin rashes, tingling ,ulcers, numbness, anxiety, and/or depression Physical Examination   Vitals:BP 110/80 (BP Location: Left upper arm, Patient Position: Sitting, BP Cuff Size: Adult)   Pulse 68   Resp 15   Ht 180.3 cm (5' 11)   Wt 76.4 kg (168 lb 6.4 oz)   SpO2 99%   BMI 23.49 kg/m  Ht:180.3 cm (5' 11) Wt:76.4 kg (168 lb 6.4 oz) ADJ:Anib surface area is  1.96 meters squared. Body mass index is 23.49 kg/m. Appearance: well appearing in no acute distress HEENT: Pupils equally reactive to light and accomodation, no xanthalasma   Neck: Supple, no apparent thyromegaly, masses, or lymphadenopathy  Lungs: normal respiratory effort; no crackles, no rhonchi, no wheezes Heart: Regular rate and rhythm.  Normal S1 S2  No gallops, 2+ aortic murmur, no rub, PMI is normal size and placement. carotid upstroke normal without bruit. Jugular venous pressure is normal Abdomen: soft, nontender, not distended with normal bowel sounds. No apparent hepatosplenomegally. Abdominal aorta is normal size without bruit Extremities: no edema, no ulcers, no clubbing, no cyanosis Peripheral Pulses: 2+ in upper extremities, 2+ femoral pulses bilaterally, 2+lower extremity  Musculoskeletal;  Normal muscle tone without kyphosis Neurological:   Oriented and Alert, Cranial nerves intact  Assessment   63 y.o. male with  Encounter Diagnoses  Name Primary?  . Atherosclerosis of aorta Yes  . Benign essential HTN   . Bradycardia   . Coronary artery disease involving native coronary artery of native heart without angina pectoris   . Dilated cardiomyopathy (CMS/HHS-HCC)   . Moderate aortic valve stenosis            Plan   1.  Moderate aortic valve stenosis: With recent reevaluation by echo 01/2023 with no significant change -No change to current management   2.  Hypertension: Currently at 110/80 in the office today with no medication side effects at this time. -Continue amlodipine , carvedilol , Lasix  with no changes   3.  Dilated cardiomyopathy stable at this time with improvement in leg swelling -Continue amlodipine , carvedilol  -Continue Lasix  40 mg daily -Call our office if continuing to have worsening shortness of breath or leg edema or edema fails to resolve after increased dose of Lasix    4.  Bradycardia: Currently stable at 68 in the office today -Continue to watch for any symptomatic bradycardia.    5.  Coronary artery disease: With abnormal nuclear imaging on stress test 08/16/2023.  Asymptomatic -Continue pravastatin , warfarin for secondary ASCVD risk reduction -Continue carvedilol , amlodipine  for further CV risk reduction -CT heart angiogram was ordered but not completed. Patient asymptomatic at  this time.  Patient would prefer to avoid undergoing left heart catheterization if at all possible.    No orders of the defined types were placed in this encounter.   Return in about 4 months (around 02/17/2024).   Attestation Statement:   I personally performed the service, non-incident to. (WP)   AMANDA MARIE [place], [place]

## 2024-01-11 ENCOUNTER — Other Ambulatory Visit (HOSPITAL_COMMUNITY): Payer: Self-pay

## 2024-01-11 ENCOUNTER — Other Ambulatory Visit: Payer: Self-pay

## 2024-01-11 ENCOUNTER — Telehealth (HOSPITAL_COMMUNITY): Payer: Self-pay | Admitting: Pharmacy Technician

## 2024-01-11 ENCOUNTER — Emergency Department

## 2024-01-11 ENCOUNTER — Inpatient Hospital Stay
Admission: EM | Admit: 2024-01-11 | Discharge: 2024-01-12 | DRG: 291 | Discharge status: Against Medical Advice | Attending: Internal Medicine | Admitting: Internal Medicine

## 2024-01-11 DIAGNOSIS — E785 Hyperlipidemia, unspecified: Secondary | ICD-10-CM | POA: Diagnosis present

## 2024-01-11 DIAGNOSIS — I160 Hypertensive urgency: Secondary | ICD-10-CM | POA: Diagnosis present

## 2024-01-11 DIAGNOSIS — I5023 Acute on chronic systolic (congestive) heart failure: Secondary | ICD-10-CM

## 2024-01-11 DIAGNOSIS — Z5329 Procedure and treatment not carried out because of patient's decision for other reasons: Secondary | ICD-10-CM | POA: Diagnosis not present

## 2024-01-11 DIAGNOSIS — J9601 Acute respiratory failure with hypoxia: Principal | ICD-10-CM | POA: Diagnosis present

## 2024-01-11 DIAGNOSIS — I5033 Acute on chronic diastolic (congestive) heart failure: Secondary | ICD-10-CM | POA: Diagnosis present

## 2024-01-11 DIAGNOSIS — I420 Dilated cardiomyopathy: Secondary | ICD-10-CM | POA: Diagnosis present

## 2024-01-11 DIAGNOSIS — I110 Hypertensive heart disease with heart failure: Principal | ICD-10-CM | POA: Diagnosis present

## 2024-01-11 DIAGNOSIS — Z886 Allergy status to analgesic agent status: Secondary | ICD-10-CM | POA: Diagnosis not present

## 2024-01-11 DIAGNOSIS — I2489 Other forms of acute ischemic heart disease: Secondary | ICD-10-CM | POA: Diagnosis present

## 2024-01-11 DIAGNOSIS — Z79899 Other long term (current) drug therapy: Secondary | ICD-10-CM | POA: Diagnosis not present

## 2024-01-11 DIAGNOSIS — I429 Cardiomyopathy, unspecified: Secondary | ICD-10-CM

## 2024-01-11 DIAGNOSIS — F17210 Nicotine dependence, cigarettes, uncomplicated: Secondary | ICD-10-CM | POA: Diagnosis present

## 2024-01-11 DIAGNOSIS — Z1152 Encounter for screening for COVID-19: Secondary | ICD-10-CM | POA: Diagnosis not present

## 2024-01-11 DIAGNOSIS — Z7901 Long term (current) use of anticoagulants: Secondary | ICD-10-CM

## 2024-01-11 DIAGNOSIS — I2510 Atherosclerotic heart disease of native coronary artery without angina pectoris: Secondary | ICD-10-CM | POA: Diagnosis present

## 2024-01-11 DIAGNOSIS — I509 Heart failure, unspecified: Secondary | ICD-10-CM

## 2024-01-11 DIAGNOSIS — I350 Nonrheumatic aortic (valve) stenosis: Secondary | ICD-10-CM | POA: Diagnosis present

## 2024-01-11 DIAGNOSIS — Z86711 Personal history of pulmonary embolism: Secondary | ICD-10-CM | POA: Diagnosis present

## 2024-01-11 DIAGNOSIS — Z8249 Family history of ischemic heart disease and other diseases of the circulatory system: Secondary | ICD-10-CM | POA: Diagnosis not present

## 2024-01-11 LAB — CBC WITH DIFFERENTIAL/PLATELET
Abs Immature Granulocytes: 0.03 K/uL (ref 0.00–0.07)
Basophils Absolute: 0.1 K/uL (ref 0.0–0.1)
Basophils Relative: 1 %
Eosinophils Absolute: 0.2 K/uL (ref 0.0–0.5)
Eosinophils Relative: 2 %
HCT: 41 % (ref 39.0–52.0)
Hemoglobin: 13.4 g/dL (ref 13.0–17.0)
Immature Granulocytes: 0 %
Lymphocytes Relative: 13 %
Lymphs Abs: 1.3 K/uL (ref 0.7–4.0)
MCH: 27 pg (ref 26.0–34.0)
MCHC: 32.7 g/dL (ref 30.0–36.0)
MCV: 82.5 fL (ref 80.0–100.0)
Monocytes Absolute: 0.4 K/uL (ref 0.1–1.0)
Monocytes Relative: 4 %
Neutro Abs: 7.9 K/uL — ABNORMAL HIGH (ref 1.7–7.7)
Neutrophils Relative %: 80 %
Platelets: 253 K/uL (ref 150–400)
RBC: 4.97 MIL/uL (ref 4.22–5.81)
RDW: 24.9 % — ABNORMAL HIGH (ref 11.5–15.5)
WBC: 9.9 K/uL (ref 4.0–10.5)
nRBC: 0 % (ref 0.0–0.2)

## 2024-01-11 LAB — BLOOD GAS, VENOUS
Acid-Base Excess: 0.1 mmol/L (ref 0.0–2.0)
Bicarbonate: 27.6 mmol/L (ref 20.0–28.0)
O2 Saturation: 70.2 %
Patient temperature: 37
pCO2, Ven: 56 mmHg (ref 44–60)
pH, Ven: 7.3 (ref 7.25–7.43)
pO2, Ven: 45 mmHg (ref 32–45)

## 2024-01-11 LAB — CBC
HCT: 39.1 % (ref 39.0–52.0)
Hemoglobin: 13 g/dL (ref 13.0–17.0)
MCH: 27 pg (ref 26.0–34.0)
MCHC: 33.2 g/dL (ref 30.0–36.0)
MCV: 81.1 fL (ref 80.0–100.0)
Platelets: 237 K/uL (ref 150–400)
RBC: 4.82 MIL/uL (ref 4.22–5.81)
RDW: 24.8 % — ABNORMAL HIGH (ref 11.5–15.5)
WBC: 9.5 K/uL (ref 4.0–10.5)
nRBC: 0 % (ref 0.0–0.2)

## 2024-01-11 LAB — COMPREHENSIVE METABOLIC PANEL WITH GFR
ALT: 27 U/L (ref 0–44)
AST: 56 U/L — ABNORMAL HIGH (ref 15–41)
Albumin: 3.3 g/dL — ABNORMAL LOW (ref 3.5–5.0)
Alkaline Phosphatase: 85 U/L (ref 38–126)
Anion gap: 7 (ref 5–15)
BUN: 25 mg/dL — ABNORMAL HIGH (ref 8–23)
CO2: 25 mmol/L (ref 22–32)
Calcium: 9.1 mg/dL (ref 8.9–10.3)
Chloride: 102 mmol/L (ref 98–111)
Creatinine, Ser: 1.45 mg/dL — ABNORMAL HIGH (ref 0.61–1.24)
GFR, Estimated: 54 mL/min — ABNORMAL LOW (ref >60)
Glucose, Bld: 145 mg/dL — ABNORMAL HIGH (ref 70–99)
Potassium: 4.2 mmol/L (ref 3.5–5.1)
Sodium: 134 mmol/L — ABNORMAL LOW (ref 135–145)
Total Bilirubin: 1.1 mg/dL (ref 0.0–1.2)
Total Protein: 8.7 g/dL — ABNORMAL HIGH (ref 6.5–8.1)

## 2024-01-11 LAB — CREATININE, SERUM
Creatinine, Ser: 1.31 mg/dL — ABNORMAL HIGH (ref 0.61–1.24)
GFR, Estimated: >60 mL/min (ref >60)

## 2024-01-11 LAB — TROPONIN I (HIGH SENSITIVITY)
Troponin I (High Sensitivity): 68 ng/L — ABNORMAL HIGH (ref <18)
Troponin I (High Sensitivity): 109 ng/L (ref <18)
Troponin I (High Sensitivity): 200 ng/L (ref <18)
Troponin I (High Sensitivity): 370 ng/L (ref <18)

## 2024-01-11 LAB — SARS CORONAVIRUS 2 BY RT PCR: SARS Coronavirus 2 by RT PCR: NEGATIVE

## 2024-01-11 LAB — PROTIME-INR
INR: 1.6 — ABNORMAL HIGH (ref 0.8–1.2)
Prothrombin Time: 19.9 s — ABNORMAL HIGH (ref 11.4–15.2)

## 2024-01-11 LAB — HIV ANTIBODY (ROUTINE TESTING W REFLEX): HIV Screen 4th Generation wRfx: NONREACTIVE

## 2024-01-11 LAB — BRAIN NATRIURETIC PEPTIDE: B Natriuretic Peptide: 530.2 pg/mL — ABNORMAL HIGH (ref 0.0–100.0)

## 2024-01-11 MED ORDER — POLYETHYLENE GLYCOL 3350 17 G PO PACK: 17.0000 g | PACK | Freq: Every day | ORAL | Status: DC | PRN

## 2024-01-11 MED ORDER — AMLODIPINE BESYLATE 5 MG PO TABS
5.0000 mg | ORAL_TABLET | Freq: Every day | ORAL | Status: DC
  Administered 2024-01-11 – 2024-01-12 (×3): 5 mg via ORAL
  Filled 2024-01-11 (×2): qty 1

## 2024-01-11 MED ORDER — ONDANSETRON HCL 4 MG/2ML IJ SOLN: 4.0000 mg | Freq: Four times a day (QID) | INTRAMUSCULAR | Status: DC | PRN

## 2024-01-11 MED ORDER — ACETAMINOPHEN 325 MG RE SUPP: 650.0000 mg | Freq: Four times a day (QID) | RECTAL | Status: DC | PRN

## 2024-01-11 MED ORDER — LOSARTAN POTASSIUM 50 MG PO TABS
50.0000 mg | ORAL_TABLET | Freq: Every day | ORAL | Status: DC
  Administered 2024-01-11 – 2024-01-12 (×3): 50 mg via ORAL
  Filled 2024-01-11 (×2): qty 1

## 2024-01-11 MED ORDER — HYDRALAZINE HCL 20 MG/ML IJ SOLN
5.0000 mg | Freq: Once | INTRAMUSCULAR | Status: AC
  Administered 2024-01-11 (×2): 5 mg via INTRAVENOUS
  Filled 2024-01-11: qty 1

## 2024-01-11 MED ORDER — ENOXAPARIN SODIUM 40 MG/0.4ML IJ SOSY: 40.0000 mg | PREFILLED_SYRINGE | INTRAMUSCULAR | Status: DC

## 2024-01-11 MED ORDER — FUROSEMIDE 10 MG/ML IJ SOLN
80.0000 mg | Freq: Once | INTRAMUSCULAR | Status: AC
  Administered 2024-01-11 (×2): 80 mg via INTRAVENOUS
  Filled 2024-01-11: qty 8

## 2024-01-11 MED ORDER — FUROSEMIDE 10 MG/ML IJ SOLN
40.0000 mg | Freq: Two times a day (BID) | INTRAMUSCULAR | Status: DC
  Administered 2024-01-11 (×2): 40 mg via INTRAVENOUS
  Filled 2024-01-11: qty 4

## 2024-01-11 MED ORDER — OXYCODONE HCL 5 MG PO TABS: 5.0000 mg | ORAL_TABLET | Freq: Four times a day (QID) | ORAL | Status: DC | PRN

## 2024-01-11 MED ORDER — WARFARIN SODIUM 5 MG PO TABS
5.0000 mg | ORAL_TABLET | Freq: Every day | ORAL | Status: AC
  Administered 2024-01-11 (×2): 5 mg via ORAL
  Filled 2024-01-11: qty 1

## 2024-01-11 MED ORDER — WARFARIN - PHYSICIAN DOSING INPATIENT
Freq: Every day | Status: DC
  Filled 2024-01-11: qty 1

## 2024-01-11 MED ORDER — WARFARIN SODIUM 7.5 MG PO TABS
7.5000 mg | ORAL_TABLET | Freq: Once | ORAL | Status: DC
  Filled 2024-01-11: qty 1

## 2024-01-11 MED ORDER — WARFARIN - PHARMACIST DOSING INPATIENT
Freq: Every day | Status: DC
  Filled 2024-01-11: qty 1

## 2024-01-11 MED ORDER — ONDANSETRON HCL 4 MG PO TABS: 4.0000 mg | ORAL_TABLET | Freq: Four times a day (QID) | ORAL | Status: DC | PRN

## 2024-01-11 MED ORDER — ACETAMINOPHEN 325 MG PO TABS: 650.0000 mg | ORAL_TABLET | Freq: Four times a day (QID) | ORAL | Status: DC | PRN

## 2024-01-11 MED ORDER — DAPAGLIFLOZIN PROPANEDIOL 10 MG PO TABS
10.0000 mg | ORAL_TABLET | Freq: Every day | ORAL | Status: DC
  Administered 2024-01-11 – 2024-01-12 (×3): 10 mg via ORAL
  Filled 2024-01-11 (×2): qty 1

## 2024-01-11 MED ORDER — PRAVASTATIN SODIUM 20 MG PO TABS
40.0000 mg | ORAL_TABLET | Freq: Every day | ORAL | Status: DC
  Administered 2024-01-11 (×2): 40 mg via ORAL
  Filled 2024-01-11: qty 2

## 2024-01-11 MED ORDER — CARVEDILOL 6.25 MG PO TABS
3.1250 mg | ORAL_TABLET | Freq: Two times a day (BID) | ORAL | Status: DC
  Administered 2024-01-11 – 2024-01-12 (×5): 3.125 mg via ORAL
  Filled 2024-01-11 (×3): qty 1

## 2024-01-11 NOTE — ED Notes (Signed)
 Pt moved to chair for comfort, pt states that he has been sitting in the bed too long, pt talking in full sentences, resps even and unlabored, pt satting 97-98 on 4L via [place].

## 2024-01-11 NOTE — ED Notes (Signed)
 Pt removed from BiPAP and staretd on 4L [place] per MD order. Pt is 91% on 4L [place] and 18 RR.

## 2024-01-11 NOTE — Group Note (Deleted)
 Date:  01/11/2024 Time:  2:21 PM  Group Topic/Focus:  Wellness Toolbox:   The focus of this group is to discuss various aspects of wellness, balancing those aspects and exploring ways to increase the ability to experience wellness.  Patients will create a wellness toolbox for use upon discharge.     Participation Level:  {BHH PARTICIPATION OZCZO:77735}  Participation Quality:  {BHH PARTICIPATION QUALITY:22265}  Affect:  {BHH AFFECT:22266}  Cognitive:  {BHH COGNITIVE:22267}  Insight: {BHH Insight2:20797}  Engagement in Group:  {BHH ENGAGEMENT IN HMNLE:77731}  Modes of Intervention:  {BHH MODES OF INTERVENTION:22269}  Additional Comments:  ***  Myra Curtistine BROCKS 01/11/2024, 2:21 PM

## 2024-01-11 NOTE — ED Notes (Signed)
 Date and time results received: 01/11/24 1231 (use smartphrase .now to insert current time)  Test: Trop Critical Value: 109  Name of Provider Notified: Paudel

## 2024-01-11 NOTE — Consult Note (Addendum)
 [facility] CLINIC CARDIOLOGY CONSULT NOTE       Patient ID: Mansel Strother MRN: [id number] DOB/AGE: 07/12/1960 63 y.o.  Admit date: 01/11/2024 Referring Physician Dr. Roann Primary Physician Buren Rock HERO, MD Primary Cardiologist Alan Mam, PA-C Reason for Consultation CHF, elevated trops  HPI: Danyl Deems is a 63 y.o. male  with a past medical history of coronary artery disease (moderate 2 vessel disease per [facility] 2020), dilated cardiomyopathy, hypertension, hyperlipidemia, moderate aortic stenosis, hx bradycardia, hx PE who presented to the ED on 01/11/2024 for acute onset shortness of breath, pink frothy sputum and cough. Patient endorses worsening lower extremity swelling and orthopnea. Denies chest pain. Cardiology was consulted for further evaluation.   Work up in the ED notable for ABG within normal limits, Na 134, K 4.2, Cr 1.45, CO2 25, Hgb 13.4, plts 243, WBC 9.9. Trops elevated and trending 68 > 109. EKG with sinus rhythm, rate 87 bpm with LVH (similar to prior EKGs) without acute ischemic changes. BNP elevated at 530. CXR with pulmonary vascular congestion. Patient received 1x IV lasix  80 mg and 1x IV hydralazine  5 mg.   At the time of my evaluation this afternoon, patient was resting comfortably in ED on bedside chair in no acute distress. We discussed patients sxs in further detail. Patient states early this morning he woke up with acute shortness of breath a/w cough and pink/red phlegm. Patient states the night before he had no SOB. Patient endorses worsening lower extremity swelling. Patient denies chest pain, palpitations or lightheadedness. Patient states he's had issues with intermittent  lower extremity swelling for the past 6 months. Patient states he takes lasix  40 mg daily and never misses a dose. Patient reports he has episodes of chest pressure and SOB with exertion that occurs about 1x/week for the past few years that always relieves with rest. Patient lives at home alone,  performs all ADLs, gardens and mows the lawn with no concerns. Patient states he uses no assisted device to walk and sues no O2 at home. Discussed patients previous cardiac stress test results and patient states he was unaware of a Coronary CT that was ordered. Patient states he doesn't want to do a LHC and wants to avoid it as much as possible. Patient states currently he feels a lot better, SOB much improved s/p IV lasix . Patient reports great UOP.   Pertinent Cardiac History (Most recent) Stress (08/16/2023) Pharmacological stress test is abnormal.  SPECT images demonstrate medium size, moderate to severe intensity,  partially reversible defect in basal to apical inferior wall and true apex  suggestive of possible ischemia with prior infarction in RCA territory,  sum difference score 4.  No TID.  Left ventricle dilated with mildly reduced LVEF of 48%.  Intermediate risk study.  LVEF = 48%  R/LHC 2020 Mid RCA lesion is 50% stenosed. Mid LAD lesion is 50% stenosed with 50% stenosed side branch in 2nd Sept. Mid Cx lesion is 25% stenosed.    Review of systems complete and found to be negative unless listed above    Past Medical History:  Diagnosis Date   Aortic stenosis    Arthritis    GI bleeding    PE (pulmonary thromboembolism) (HCC)    Thrombus of atrial appendage     Past Surgical History:  Procedure Laterality Date   COLONOSCOPY WITH PROPOFOL  N/A 01/28/2018   Procedure: COLONOSCOPY WITH PROPOFOL ;  Surgeon: Therisa Bi, MD;  Location: [facility] ENDOSCOPY;  Service: Gastroenterology;  Laterality: N/A;   NO  PAST SURGERIES     RIGHT/LEFT HEART CATH AND CORONARY ANGIOGRAPHY Bilateral 03/14/2019   Procedure: RIGHT/LEFT HEART CATH AND CORONARY ANGIOGRAPHY;  Surgeon: Hester Wolm PARAS, MD;  Location: [facility] INVASIVE CV LAB;  Service: Cardiovascular;  Laterality: Bilateral;    (Not in a hospital admission)  Social History   Socioeconomic History   Marital status: Single    Spouse  name: Not on file   Number of children: Not on file   Years of education: Not on file   Highest education level: Not on file  Occupational History   Not on file  Tobacco Use   Smoking status: Every Day    Types: Cigarettes   Smokeless tobacco: Never  Vaping Use   Vaping status: Never Used  Substance and Sexual Activity   Alcohol use: Yes    Comment: 2 beers per day   Drug use: Never   Sexual activity: Not on file  Other Topics Concern   Not on file  Social History Narrative   Not on file   Social Drivers of Health   Financial Resource Strain: Not on file  Food Insecurity: Not on file  Transportation Needs: Not on file  Physical Activity: Not on file  Stress: Not on file  Social Connections: Not on file  Intimate Partner Violence: Not on file    Family History  Problem Relation Age of Onset   CAD Mother      Vitals:   01/11/24 1000 01/11/24 1030 01/11/24 1100 01/11/24 1115  BP: (!) 178/66 (!) 182/69 (!) 179/60   Pulse: 72 80 74 71  Resp: 18 20 19 20   Temp:      TempSrc:      SpO2: 100% 92% 90% 93%  Weight:      Height:        PHYSICAL EXAM General: Chronically ill appearing male, well nourished, in no acute distress. HEENT: Normocephalic and atraumatic. Neck: No JVD.   Lungs: Normal respiratory effort on 4L. Diminished breath sounds bilaterally with bibasilar crackles.  Heart: HRRR. Normal S1 and S2 without gallops or murmurs.  Abdomen: Non-distended appearing.  Msk: Normal strength and tone for age. Extremities: Warm and well perfused. No clubbing, cyanosis. 2+ pitting edema L > R.  Neuro: Alert and oriented X 3. Psych: Answers questions appropriately.   Labs: Basic Metabolic Panel: Recent Labs    01/11/24 0834  NA 134*  K 4.2  CL 102  CO2 25  GLUCOSE 145*  BUN 25*  CREATININE 1.45*  CALCIUM 9.1   Liver Function Tests: Recent Labs    01/11/24 0834  AST 56*  ALT 27  ALKPHOS 85  BILITOT 1.1  PROT 8.7*  ALBUMIN 3.3*   No results for  input(s): LIPASE, AMYLASE in the last 72 hours. CBC: Recent Labs    01/11/24 0834  WBC 9.9  NEUTROABS 7.9*  HGB 13.4  HCT 41.0  MCV 82.5  PLT 253   Cardiac Enzymes: Recent Labs    01/11/24 0834 01/11/24 1118  TROPONINIHS 68* 109*   BNP: Recent Labs    01/11/24 0834  BNP 530.2*   D-Dimer: No results for input(s): DDIMER in the last 72 hours. Hemoglobin A1C: No results for input(s): HGBA1C in the last 72 hours. Fasting Lipid Panel: No results for input(s): CHOL, HDL, LDLCALC, TRIG, CHOLHDL, LDLDIRECT in the last 72 hours. Thyroid Function Tests: No results for input(s): TSH, T4TOTAL, T3FREE, THYROIDAB in the last 72 hours.  Invalid input(s): FREET3 Anemia Panel: No results  for input(s): VITAMINB12, FOLATE, FERRITIN, TIBC, IRON , RETICCTPCT in the last 72 hours.   Radiology: [facility] Chest Port 1 View Result Date: 01/11/2024 CLINICAL DATA:  Shortness of breath. EXAM: PORTABLE CHEST 1 VIEW COMPARISON:  02/26/2019. FINDINGS: The heart size and mediastinal contours are within normal limits. Right mid and lower lung zone airspace opacity. Diffuse bilateral interstitial opacities. The left costophrenic angle is not included within the field of view. No sizable pleural effusion. No pneumothorax. No acute osseous abnormality. IMPRESSION: Diffuse bilateral interstitial opacities with dense right mid and lower lung zone airspace disease, concerning for asymmetric pulmonary edema versus infection. Electronically Signed   By: Harrietta Sherry M.D.   On: 01/11/2024 09:07    ECHO ordered  TELEMETRY reviewed by me 01/11/2024: sinus rhythm, rate 70s  EKG reviewed by me: sinus rhythm, rate 87 bpm with LVH (similar to prior EKGs)  Data reviewed by me 01/11/2024: last 24h vitals tele labs imaging I/O ED provider note, admission H&P.  Principal Problem:   CHF (congestive heart failure) (HCC) Active Problems:   Aortic stenosis   History of pulmonary  embolism   Cardiomyopathy (HCC)    ASSESSMENT AND PLAN:  Jaydon Avina is a 63 y.o. male  with a past medical history of coronary artery disease (moderate 2 vessel disease per LHC 2020), dilated cardiomyopathy, hypertension, hyperlipidemia, moderate aortic stenosis, hx bradycardia, hx PE (on Warfarin) who presented to the ED on 01/11/2024 for acute onset shortness of breath, pink frothy sputum and cough. Patient endorses worsening lower extremity swelling and orthopnea. Denies chest pain. Cardiology was consulted for further evaluation.   # Acute on chronic HFpEF # Hypertensive urgency with flash pulmonary edema # Acute respiratory failure with hypoxia # Aortic Stenosis  BNP elevated at 530. CXR with pulmonary vascular congestion. -Echo ordered. Further recommendations pending results.  -Monitor and replenish electrolytes for a goal K >4, Mag >2  -Continue IV lasix  40 mg BID. Closely monitor UOP and renal function.  (Pt states he can't take torsemide, causes bad cramps) -Ordered dapagliflozin  10 mg daily for GDMT optimization. (Copay $ 12.15)Losartan  and coreg  as stated below.  # Coronary artery disease # Hypertension # Hyperlipidemia # Elevated troponin, likely demand ischemia due to above Patient without chest pain. Trops elevated and trending 68 > 109 > 200. EKG with sinus rhythm, rate 87 bpm with LVH (similar to prior EKGs) without acute ischemic changes. Of note, Lexiscan 08/16/23 showed moderate intensity partially reversible defect in basal to apical inferior wall suggestive of possible ischemia with prior infarction in RCA territory.  -Continue to trend troponins until peaked.  -Patient has documented allergy to ASA.  -Continue home amlodipine  5 mg daily. -Ordered losartan  50 mg daily for BP control. -Continue home Coreg  3.125 mg BID. Avoid uptitration until closer to euvolemia. -Continue pravastatin  40 mg daily.  -Abnormal stress test on 07/2023 would recommend rescheduling  outpatient Coronary CTA or proceeding with outpatient LHC if patient becomes agreeable. Currently, patient requests to perform CTA instead of LHC and would like to avoid LHC if possible.   # Hx Pulmonary Embolism -Warfarin management per pharmacy.    This patient's plan of care was discussed and created with Dr. Ammon and he is in agreement.  Signed: Dorene Comfort, PA-C  01/11/2024, 12:41 PM [facility]

## 2024-01-11 NOTE — ED Triage Notes (Signed)
 Pt to er room number 16 via ems, per ems pt was awakened with shortness of breath around 5am, states that he was also having some pink frothy sputum, states that they attempted cpap and, pt refused, pt presents on nRb.

## 2024-01-11 NOTE — ED Provider Notes (Signed)
 [facility] Provider Note    Event Date/Time   First MD Initiated Contact with Patient 01/11/24 0827     (approximate)   History   Shortness of Breath   HPI  James Shepherd is a 63 y.o. male with history of aortic stenosis, CAD, dilated cardiomyopathy, PE, hypertension, bradycardia who presents with shortness of breath, acute onset this morning around 5 AM, persistent course since then.  He reported a cough with pink frothy sputum.  EMS attempted CPAP but the patient refused.  He denies any acute pain.  I reviewed the past medical records.  The patient was most recently evaluated by cardiology on 5/19 for follow-up of his chronic conditions.  He had some worsening leg edema at that time but otherwise was not having any respiratory symptoms.  He is on warfarin.   Physical Exam   Triage Vital Signs: ED Triage Vitals [01/11/24 0821]  Encounter Vitals Group     BP (!) 203/76     Girls Systolic BP Percentile      Girls Diastolic BP Percentile      Boys Systolic BP Percentile      Boys Diastolic BP Percentile      Pulse Rate 95     Resp (!) 24     Temp      Temp src      SpO2 (!) 86 %     Weight      Height      Head Circumference      Peak Flow      Pain Score      Pain Loc      Pain Education      Exclude from Growth Chart     Most recent vital signs: Vitals:   01/11/24 0856 01/11/24 0900  BP:  (!) 191/84  Pulse:  81  Resp:  18  Temp: (!) 97.5 F (36.4 C)   SpO2:  100%     General: Alert, somewhat weak appearing, no distress.  CV:  Good peripheral perfusion.  Resp:  Normal effort.  Diminished breath sounds bilaterally.  No significant wheezes or rales. Abd:  No distention.  Other:  1+ bilateral lower extremity edema.   ED Results / Procedures / Treatments   Labs (all labs ordered are listed, but only abnormal results are displayed) Labs Reviewed  COMPREHENSIVE METABOLIC PANEL WITH GFR - Abnormal; Notable for the following  components:      Result Value   Sodium 134 (*)    Glucose, Bld 145 (*)    BUN 25 (*)    Creatinine, Ser 1.45 (*)    Total Protein 8.7 (*)    Albumin 3.3 (*)    AST 56 (*)    GFR, Estimated 54 (*)    All other components within normal limits  CBC WITH DIFFERENTIAL/PLATELET - Abnormal; Notable for the following components:   RDW 24.9 (*)    Neutro Abs 7.9 (*)    All other components within normal limits  TROPONIN I (HIGH SENSITIVITY) - Abnormal; Notable for the following components:   Troponin I (High Sensitivity) 68 (*)    All other components within normal limits  SARS CORONAVIRUS 2 BY RT PCR  BLOOD GAS, VENOUS  BRAIN NATRIURETIC PEPTIDE  TROPONIN I (HIGH SENSITIVITY)     EKG  ED ECG REPORT I, Waylon Cassis, the attending physician, personally viewed and interpreted this ECG.  Date: 01/11/2024 EKG Time: 0836 Rate: 87 Rhythm: normal sinus rhythm QRS  Axis: normal Intervals: normal ST/T Wave abnormalities: LVH with repolarization abnormality Narrative Interpretation: no evidence of acute ischemia    RADIOLOGY  Chest x-ray: I independently viewed and interpreted the images; there is bilateral interstitial opacity consistent with edema  PROCEDURES:  Critical Care performed: Yes, see critical care procedure note(s)  .Critical Care  Performed by: Jacolyn Pae, MD Authorized by: Jacolyn Pae, MD   Critical care provider statement:    Critical care time (minutes):  30   Critical care time was exclusive of:  Separately billable procedures and treating other patients   Critical care was necessary to treat or prevent imminent or life-threatening deterioration of the following conditions:  Respiratory failure   Critical care was time spent personally by me on the following activities:  Development of treatment plan with patient or surrogate, discussions with consultants, evaluation of patient's response to treatment, examination of patient, ordering and  review of laboratory studies, ordering and review of radiographic studies, ordering and performing treatments and interventions, pulse oximetry, re-evaluation of patient's condition, review of old charts and obtaining history from patient or surrogate   Care discussed with: admitting provider      MEDICATIONS ORDERED IN ED: Medications  hydrALAZINE  (APRESOLINE ) injection 5 mg (has no administration in time range)  furosemide  (LASIX ) injection 80 mg (80 mg Intravenous Given 01/11/24 0840)     IMPRESSION / MDM / ASSESSMENT AND PLAN / ED COURSE  I reviewed the triage vital signs and the nursing notes.  63 year old male with PMH as noted above presents with acute onset of shortness of breath since about 5 AM associated with pink frothy sputum.  On exam the patient is weak appearing but in no acute distress.  His blood pressure is significantly elevated.  O2 saturation is in the mid 80s on nonrebreather.  He has peripheral edema.  Differential diagnosis includes, but is not limited to, acute CHF, acute fluid overload due to renal failure or other cause, acute bronchitis, pneumonia, pulmonary embolism.  Patient's presentation is most consistent with acute presentation with potential threat to life or bodily function.  The patient is on the cardiac monitor to evaluate for evidence of arrhythmia and/or significant heart rate changes.  The patient refused CPAP from EMS although is now tolerating BiPAP.  He has a good respiratory effort although appears somewhat weak.  We will continue to treat with BiPAP, monitor the blood pressure, give IV Lasix , and continue to observe the patient closely.  He is at high risk for decompensation and potential intubation.  ----------------------------------------- 10:17 AM on 01/11/2024 -----------------------------------------  The patient has significantly improved on BiPAP.  Lab workup is significant for troponin of 68.  BNP is still pending.  CMP shows mildly  elevated creatinine.  VBG is reassuring with a pCO2 of 56.  Chest x-ray shows evidence of edema consistent with CHF.  Blood pressure is slightly improved.  I consulted the hospitalist; based on our discussion he agrees to evaluate the patient for admission.   FINAL CLINICAL IMPRESSION(S) / ED DIAGNOSES   Final diagnoses:  Acute respiratory failure with hypoxia (HCC)     Rx / DC Orders   ED Discharge Orders     None        Note:  This document was prepared using Dragon voice recognition software and may include unintentional dictation errors.    Jacolyn Pae, MD 01/11/24 1018

## 2024-01-11 NOTE — Progress Notes (Signed)
 Heart Failure Navigator Progress Note  Assessed for Heart & Vascular TOC clinic readiness.  Patient does not meet criteria due to current [facility] patient.   Navigator will sign off at this time.  Charmaine Pines, RN, BSN [facility] Heart Failure Navigator Secure Chat Only

## 2024-01-11 NOTE — ED Notes (Signed)
 Pt refuses warfarin.

## 2024-01-11 NOTE — Telephone Encounter (Signed)
 Patient Product/process development scientist completed.    The patient is insured through [facility]. Patient has Medicare and is not eligible for a copay card, but may be able to apply for patient assistance or Medicare RX Payment Plan (Patient Must reach out to their plan, if eligible for payment plan), if available.    Ran test claim for Farxiga  10 mg and the current 30 day co-pay is $12.15.   This test claim was processed through [facility]- copay amounts may vary at other pharmacies due to pharmacy/plan contracts, or as the patient moves through the different stages of their insurance plan.     Reyes Sharps, CPHT Pharmacy Technician III Certified Patient Advocate [facility] Patient Advocate Team Direct Number: [phone]  Fax: [phone]

## 2024-01-11 NOTE — H&P (Signed)
 History and Physical    James Shepherd FMW:[id number] DOB: November 29, 1960 DOA: 01/11/2024  DOS: the patient was seen and examined on 01/11/2024  PCP: Buren Rock HERO, MD   Patient coming from: Home  I have personally briefly reviewed patient's old medical records in [facility]  Chief Complaint: Shortness of breath  HPI: James Shepherd is a pleasant 63 y.o. male with medical history of aortic stenosis, CAD, dilated cardiomyopathy, PE, HTN, bradycardia who presented to ED at [facility] with shortness of breath, acute onset started this morning around 5 AM, persisted since that.  He reported a cough with pink frothy sputum this morning.  EMS attempted CPAP but patient refused.  Patient denies any chest pain. Patient stated that he has been having those kind of shortness of breath every morning but they used to go away in 15 to 20 minutes.  This morning he woke up with shortness of breath but the shortness of breath persisted more than 15 to 20 minutes rather he had some cough with frothy sputum and he was not able to walk around due to shortness of breath.  He decided to call EMS for evaluation.  He was evaluated by cardiology on 5/19.  He had leg swelling chronic and he is on warfarin.  ED Course: Upon arrival to the ED, patient is found to have acute on chronic diastolic congestive heart failure.  He received Lasix  40 mg IV with some improvement.  He was initially placed on CPAP later changed to 4 L nasal cannula oxygen maintaining saturation.  He does not use oxygen at home.  His chest x-ray showed bilateral interstitial opacities consistent with pulmonary edema.  Hospitalist service was consulted for evaluation for admission.  Review of Systems:  ROS  All other systems negative except as noted in the HPI.  Past Medical History:  Diagnosis Date   Aortic stenosis    Arthritis    GI bleeding    PE (pulmonary thromboembolism) (HCC)    Thrombus of atrial appendage     Past  Surgical History:  Procedure Laterality Date   COLONOSCOPY WITH PROPOFOL  N/A 01/28/2018   Procedure: COLONOSCOPY WITH PROPOFOL ;  Surgeon: Therisa Bi, MD;  Location: [facility] ENDOSCOPY;  Service: Gastroenterology;  Laterality: N/A;   NO PAST SURGERIES     RIGHT/LEFT HEART CATH AND CORONARY ANGIOGRAPHY Bilateral 03/14/2019   Procedure: RIGHT/LEFT HEART CATH AND CORONARY ANGIOGRAPHY;  Surgeon: Hester Wolm PARAS, MD;  Location: [facility] INVASIVE CV LAB;  Service: Cardiovascular;  Laterality: Bilateral;     reports that he has been smoking cigarettes. He has never used smokeless tobacco. He reports current alcohol use. He reports that he does not use drugs.  Allergies  Allergen Reactions   Acetaminophen  Other (See Comments)   Aspirin  Other (See Comments)    Family History  Problem Relation Age of Onset   CAD Mother     Prior to Admission medications   Medication Sig Start Date End Date Taking? Authorizing Provider  amLODipine  (NORVASC ) 5 MG tablet Take 5 mg by mouth daily.   Yes [provider]  carvedilol  (COREG ) 3.125 MG tablet Take 3.125 mg by mouth 2 (two) times daily.   Yes [provider]  furosemide  (LASIX ) 40 MG tablet Take 40 mg by mouth daily.   Yes [provider]  oxyCODONE  (OXY IR/ROXICODONE ) 5 MG immediate release tablet Take 5 mg by mouth every 6 (six) hours as needed for moderate pain (pain score 4-6).   Yes [provider]  pravastatin  (PRAVACHOL ) 40 MG tablet Take 40 mg by mouth daily.   Yes [provider]  warfarin (COUMADIN ) 1 MG tablet Take 1 mg by mouth as directed.   Yes [provider]  warfarin (COUMADIN ) 5 MG tablet Take 5 mg by mouth daily.   Yes [provider]  furosemide  (LASIX ) 20 MG tablet Take 20 mg by mouth. Patient not taking: Reported on 01/11/2024    [provider]  pantoprazole  (PROTONIX ) 40 MG tablet Take 40 mg by mouth 2 (two) times daily. Patient not taking: Reported on 01/11/2024     [provider]    Physical Exam: Vitals:   01/11/24 1000 01/11/24 1030 01/11/24 1100 01/11/24 1115  BP: (!) 178/66 (!) 182/69 (!) 179/60   Pulse: 72 80 74 71  Resp: 18 20 19 20   Temp:      TempSrc:      SpO2: 100% 92% 90% 93%  Weight:      Height:        Physical Exam   Constitutional: Alert, awake, calm, comfortable HEENT: Neck supple Respiratory: Clear to auscultation B/L, no wheezing, no rales.  Cardiovascular: Regular rate and rhythm, no murmurs / rubs / gallops. No extremity edema. 2+ pedal pulses. No carotid bruits.  2+ bilateral pitting edema Abdomen: Soft, no tenderness, Bowel sounds positive.  Musculoskeletal: no clubbing / cyanosis. Good ROM, no contractures. Normal muscle tone.  Skin: no rashes, lesions, ulcers. Neurologic: CN 2-12 grossly intact. Sensation intact, No focal deficit identified Psychiatric: Alert and oriented x 3. Normal mood.    Labs on Admission: I have personally reviewed following labs and imaging studies  CBC: Recent Labs  Lab 01/11/24 0834  WBC 9.9  NEUTROABS 7.9*  HGB 13.4  HCT 41.0  MCV 82.5  PLT 253   Basic Metabolic Panel: Recent Labs  Lab 01/11/24 0834  NA 134*  K 4.2  CL 102  CO2 25  GLUCOSE 145*  BUN 25*  CREATININE 1.45*  CALCIUM 9.1   GFR: Estimated Creatinine Clearance: 56.1 mL/min (A) (by C-G formula based on SCr of 1.45 mg/dL (H)). Liver Function Tests: Recent Labs  Lab 01/11/24 0834  AST 56*  ALT 27  ALKPHOS 85  BILITOT 1.1  PROT 8.7*  ALBUMIN 3.3*   No results for input(s): LIPASE, AMYLASE in the last 168 hours. No results for input(s): AMMONIA in the last 168 hours. Coagulation Profile: No results for input(s): INR, PROTIME in the last 168 hours. Cardiac Enzymes: Recent Labs  Lab 01/11/24 0834 01/11/24 1118  TROPONINIHS 68* 109*   BNP (last 3 results) Recent Labs    01/11/24 0834  BNP 530.2*   HbA1C: No results for input(s): HGBA1C in the last 72 hours. CBG: No  results for input(s): GLUCAP in the last 168 hours. Lipid Profile: No results for input(s): CHOL, HDL, LDLCALC, TRIG, CHOLHDL, LDLDIRECT in the last 72 hours. Thyroid Function Tests: No results for input(s): TSH, T4TOTAL, FREET4, T3FREE, THYROIDAB in the last 72 hours. Anemia Panel: No results for input(s): VITAMINB12, FOLATE, FERRITIN, TIBC, IRON , RETICCTPCT in the last 72 hours. Urine analysis: No results found for: COLORURINE, APPEARANCEUR, LABSPEC, PHURINE, GLUCOSEU, HGBUR, BILIRUBINUR, KETONESUR, PROTEINUR, UROBILINOGEN, NITRITE, LEUKOCYTESUR  Radiological Exams on Admission: I have personally reviewed images DG Chest Port 1 View Result Date: 01/11/2024 CLINICAL DATA:  Shortness of breath. EXAM: PORTABLE CHEST 1 VIEW COMPARISON:  02/26/2019. FINDINGS: The heart size and mediastinal contours are within normal limits. Right mid and lower lung  zone airspace opacity. Diffuse bilateral interstitial opacities. The left costophrenic angle is not included within the field of view. No sizable pleural effusion. No pneumothorax. No acute osseous abnormality. IMPRESSION: Diffuse bilateral interstitial opacities with dense right mid and lower lung zone airspace disease, concerning for asymmetric pulmonary edema versus infection. Electronically Signed   By: Harrietta Sherry M.D.   On: 01/11/2024 09:07    EKG: My personal interpretation of EKG shows: Sinus rhythm, atrial enlargement    Assessment/Plan Principal Problem:   CHF (congestive heart failure) (HCC) Active Problems:   Aortic stenosis   History of pulmonary embolism   Cardiomyopathy (HCC)    Assessment and Plan: 63 year old male with multiple medical problems including but not limited to aortic stenosis, cardiomyopathy, CHF, CAD, history of pulmonary embolism not on home oxygen came into the hospital complaining of shortness of breath and cough with the for the sputum.  1.   Acute exacerbation of congestive heart failure/pulmonary edema - Patient was given BiPAP and Lasix  in the emergency room with some improvement. - He is currently on 4 L nasal cannula oxygen saturating around 90 and above. - He will be admitted to the hospital as inpatient for acute exacerbation of congestive heart failure. - Continue oxygen to maintain saturation more than 90% - Start on Lasix  40 mg IV twice a day - Continue his home medications including amlodipine  and Coreg  and pravastatin  - Daily weight - Input output charting  2.  Acute hypoxemic respiratory failure secondary to #1 - Patient does not use oxygen at home. - Continue oxygen to maintain saturation more than 90%  3.  Pulmonary embolism on warfarin - Continue warfarin - Pharmacy to dose warfarin.  4.  HTN/HLD - Continue home amlodipine  and Coreg  and statin  5.  Elevated troponin - This may be related to demand ischemia due to shortness of breath - Continue to monitor in telemetry - Echocardiogram to evaluate for left ventricular function - Trend troponin, monitoring telemetry - Cardiology consult will be called     DVT prophylaxis: Coumadin  Code Status: Full Code Family Communication: None around  Disposition Plan: Home  Consults called: Cardiology  Admission status: Inpatient, Telemetry bed   Nena Rebel, MD [facility] 01/11/2024, 12:36 PM

## 2024-01-11 NOTE — ED Notes (Addendum)
 Pt refused warfarin. Pt stated that they usually take a 5 mg dose and does not feel safe to take the dose ordered. MD notified. Pharmacy techs at bedside at this time.

## 2024-01-11 NOTE — Consult Note (Addendum)
 PHARMACY - ANTICOAGULATION CONSULT NOTE  Pharmacy Consult for Warfarin Indication: history of PE  Allergies  Allergen Reactions   Acetaminophen  Other (See Comments)   Aspirin  Other (See Comments)    Patient Measurements: Height: 6' (182.9 cm) Weight: 77.1 kg (170 lb) IBW/kg (Calculated) : 77.6 HEPARIN  DW (KG): 77.1  Vital Signs: Temp: 97.9 F (36.6 C) (08/13 1248) Temp Source: Oral (08/13 1248) BP: 164/71 (08/13 1335) Pulse Rate: 75 (08/13 1335)  Labs: Recent Labs    01/11/24 0834 01/11/24 1118 01/11/24 1236 01/11/24 1238 01/11/24 1339  HGB 13.4  --   --  13.0  --   HCT 41.0  --   --  39.1  --   PLT 253  --   --  237  --   LABPROT  --   --  19.9*  --   --   INR  --   --  1.6*  --   --   CREATININE 1.45*  --   --  1.31*  --   TROPONINIHS 68* 109*  --   --  200*    Estimated Creatinine Clearance: 62.1 mL/min (A) (by C-G formula based on SCr of 1.31 mg/dL (H)).   Medical History: Past Medical History:  Diagnosis Date   Aortic stenosis    Arthritis    GI bleeding    PE (pulmonary thromboembolism) (HCC)    Thrombus of atrial appendage     Medications:  Warfarin 5mg  daily Last dose: 8/12  Assessment: 63 y.o. male with medical history of aortic stenosis, CAD, dilated cardiomyopathy, PE, HTN, bradycardia who presented to ED at [facility] with shortness of breath, acute onset started this morning around 5 AM, persisted since that. Pharmacy has been consulted to dose warfarin while patient is admitted.   Goal of Therapy:  INR 2-3 Monitor platelets by anticoagulation protocol: Yes  Date: INR: Dose: 8/13 1.6   *Per patient, stated he has had a GIB in the past bc his warfarin and will only take 5mg  daily(except on every first Monday and Wednesday of the month, takes 6mg ). He will not take any dose higher(or lower) and if we try to give him something other than his PTA dose, he will leave AMA. Stated that his PCP is aware and is the one who  told him not to go higher or lower on dose*  Plan:  - Warfarin 5mg  daily ordered - Due to reasons stated above, Pharmacy team will sign off. Please re-consult if further assistance is needed   Bob Daversa A Sruthi Maurer, PharmD Clinical Pharmacist 01/11/2024 5:33 PM

## 2024-01-11 NOTE — TOC CM/SW Note (Addendum)
..  Transition of Care [facility]) - Inpatient Brief Assessment   Patient Details  Name: James Shepherd MRN: [id number] Date of Birth: [birth year]  Transition of Care [facility]) CM/SW Contact:    Edsel DELENA Fischer, LCSW Phone Number: 01/11/2024, 11:50 AM   Clinical Narrative:  SW to handoff to heart failure team to follow up with pt. If additional needs are present, sw to address   Transition of Care Asessment:

## 2024-01-12 DIAGNOSIS — I509 Heart failure, unspecified: Secondary | ICD-10-CM

## 2024-01-12 LAB — CBC
HCT: 31.3 % — ABNORMAL LOW (ref 39.0–52.0)
Hemoglobin: 10.5 g/dL — ABNORMAL LOW (ref 13.0–17.0)
MCH: 27.1 pg (ref 26.0–34.0)
MCHC: 33.5 g/dL (ref 30.0–36.0)
MCV: 80.7 fL (ref 80.0–100.0)
Platelets: 198 K/uL (ref 150–400)
RBC: 3.88 MIL/uL — ABNORMAL LOW (ref 4.22–5.81)
RDW: 24.5 % — ABNORMAL HIGH (ref 11.5–15.5)
WBC: 7.5 K/uL (ref 4.0–10.5)
nRBC: 0 % (ref 0.0–0.2)

## 2024-01-12 LAB — COMPREHENSIVE METABOLIC PANEL WITH GFR
ALT: 15 U/L (ref 0–44)
AST: 30 U/L (ref 15–41)
Albumin: 2.6 g/dL — ABNORMAL LOW (ref 3.5–5.0)
Alkaline Phosphatase: 55 U/L (ref 38–126)
Anion gap: 6 (ref 5–15)
BUN: 27 mg/dL — ABNORMAL HIGH (ref 8–23)
CO2: 24 mmol/L (ref 22–32)
Calcium: 8.1 mg/dL — ABNORMAL LOW (ref 8.9–10.3)
Chloride: 106 mmol/L (ref 98–111)
Creatinine, Ser: 1.42 mg/dL — ABNORMAL HIGH (ref 0.61–1.24)
GFR, Estimated: 55 mL/min — ABNORMAL LOW (ref >60)
Glucose, Bld: 102 mg/dL — ABNORMAL HIGH (ref 70–99)
Potassium: 3.6 mmol/L (ref 3.5–5.1)
Sodium: 136 mmol/L (ref 135–145)
Total Bilirubin: 1.2 mg/dL (ref 0.0–1.2)
Total Protein: 7.1 g/dL (ref 6.5–8.1)

## 2024-01-12 LAB — TROPONIN I (HIGH SENSITIVITY): Troponin I (High Sensitivity): 467 ng/L (ref <18)

## 2024-01-12 LAB — PROTIME-INR
INR: 1.8 — ABNORMAL HIGH (ref 0.8–1.2)
Prothrombin Time: 21.6 s — ABNORMAL HIGH (ref 11.4–15.2)

## 2024-01-12 MED ORDER — FUROSEMIDE 10 MG/ML IJ SOLN
40.0000 mg | Freq: Two times a day (BID) | INTRAMUSCULAR | Status: AC
  Administered 2024-01-12: 40 mg via INTRAVENOUS
  Filled 2024-01-12: qty 4

## 2024-01-12 MED ORDER — AMLODIPINE BESYLATE 5 MG PO TABS: 10.0000 mg | ORAL_TABLET | Freq: Every day | ORAL | Status: DC

## 2024-01-12 MED ORDER — FUROSEMIDE 40 MG PO TABS: 20.0000 mg | ORAL_TABLET | Freq: Every evening | ORAL | Status: DC

## 2024-01-12 MED ORDER — FUROSEMIDE 40 MG PO TABS: 40.0000 mg | ORAL_TABLET | Freq: Two times a day (BID) | ORAL | Status: DC

## 2024-01-12 MED ORDER — FUROSEMIDE 40 MG PO TABS: 40.0000 mg | ORAL_TABLET | Freq: Every morning | ORAL | Status: DC

## 2024-01-12 NOTE — Progress Notes (Addendum)
 [facility] CLINIC CARDIOLOGY PROGRESS NOTE       Patient ID: James Shepherd MRN: [id number] DOB/AGE: 64/18/1962 64 y.o.  Admit date: 01/11/2024 Referring Physician Dr. Roann Primary Physician Buren Rock HERO, MD Primary Cardiologist Alan Mam, PA-C Reason for Consultation CHF, elevated trops  HPI: James Shepherd is a 63 y.o. male  with a past medical history of coronary artery disease (moderate 2 vessel disease per [facility] 2020), dilated cardiomyopathy, hypertension, hyperlipidemia, moderate aortic stenosis, hx bradycardia, hx PE who presented to the ED on 01/11/2024 for acute onset shortness of breath, pink frothy sputum and cough. Patient endorses worsening lower extremity swelling and orthopnea. Denies chest pain. Cardiology was consulted for further evaluation.   Interval History: -Patient seen and examined this AM and sitting in bedside chair. Patient states he feels great and denies any SOB or chest pain.  Patient is very eager to go home and states he will leave AMA if he is not discharged today because he needs to feed his dogs and scared his dogs will die if he's not home today.  -Lower extremity edema much improved but still has mild bibasilar crackles and recommended that patient's stay in hospital for continued diuresis. -Patients BP elevated and HR  stable this AM. Overnight Tele showed no significant events.  -No documented UOP.  Patient reports great UOP with stable renal function. -Patient weaned to room air with stable SpO2.  -Recommend patient ambulate.    Pertinent Cardiac History (Most recent) Stress (08/16/2023) Pharmacological stress test is abnormal.  SPECT images demonstrate medium size, moderate to severe intensity,  partially reversible defect in basal to apical inferior wall and true apex  suggestive of possible ischemia with prior infarction in RCA territory,  sum difference score 4.  No TID.  Left ventricle dilated with mildly reduced LVEF of 48%.  Intermediate risk  study.  LVEF = 48%  R/[facility] 2020 Mid RCA lesion is 50% stenosed. Mid LAD lesion is 50% stenosed with 50% stenosed side branch in 2nd Sept. Mid Cx lesion is 25% stenosed.    Review of systems complete and found to be negative unless listed above    Past Medical History:  Diagnosis Date   Aortic stenosis    Arthritis    GI bleeding    PE (pulmonary thromboembolism) (HCC)    Thrombus of atrial appendage     Past Surgical History:  Procedure Laterality Date   COLONOSCOPY WITH PROPOFOL  N/A 01/28/2018   Procedure: COLONOSCOPY WITH PROPOFOL ;  Surgeon: Therisa Bi, MD;  Location: [facility] ENDOSCOPY;  Service: Gastroenterology;  Laterality: N/A;   NO PAST SURGERIES     RIGHT/LEFT HEART CATH AND CORONARY ANGIOGRAPHY Bilateral 03/14/2019   Procedure: RIGHT/LEFT HEART CATH AND CORONARY ANGIOGRAPHY;  Surgeon: Hester Wolm PARAS, MD;  Location: [facility] INVASIVE CV LAB;  Service: Cardiovascular;  Laterality: Bilateral;    (Not in a hospital admission)  Social History   Socioeconomic History   Marital status: Single    Spouse name: Not on file   Number of children: Not on file   Years of education: Not on file   Highest education level: Not on file  Occupational History   Not on file  Tobacco Use   Smoking status: Every Day    Types: Cigarettes   Smokeless tobacco: Never  Vaping Use   Vaping status: Never Used  Substance and Sexual Activity   Alcohol use: Yes    Comment: 2 beers per day   Drug use: Never   Sexual activity: Not  on file  Other Topics Concern   Not on file  Social History Narrative   Not on file   Social Drivers of Health   Financial Resource Strain: Not on file  Food Insecurity: Not on file  Transportation Needs: Not on file  Physical Activity: Not on file  Stress: Not on file  Social Connections: Not on file  Intimate Partner Violence: Not on file    Family History  Problem Relation Age of Onset   CAD Mother      Vitals:   01/12/24 0530 01/12/24 0600  01/12/24 0630 01/12/24 0715  BP: (!) 118/53 (!) 114/45 (!) 146/56   Pulse: (!) 52 (!) 50 (!) 52   Resp: 11 13 14    Temp:      TempSrc:      SpO2: 95% 97% 98% 97%  Weight:      Height:        PHYSICAL EXAM General: Chronically ill appearing male, well nourished, in no acute distress. HEENT: Normocephalic and atraumatic. Neck: No JVD.   Lungs: Normal respiratory effort on room air. Mild bibasilar crackles.  Heart: HRRR. Normal S1 and S2 without gallops or murmurs.  Abdomen: Non-distended appearing.  Msk: Normal strength and tone for age. Extremities: Warm and well perfused. No clubbing, cyanosis. Trace pitting edema L > R.  Neuro: Alert and oriented X 3. Psych: Answers questions appropriately.   Labs: Basic Metabolic Panel: Recent Labs    01/11/24 0834 01/11/24 1238 01/12/24 0442  NA 134*  --  136  K 4.2  --  3.6  CL 102  --  106  CO2 25  --  24  GLUCOSE 145*  --  102*  BUN 25*  --  27*  CREATININE 1.45* 1.31* 1.42*  CALCIUM 9.1  --  8.1*   Liver Function Tests: Recent Labs    01/11/24 0834 01/12/24 0442  AST 56* 30  ALT 27 15  ALKPHOS 85 55  BILITOT 1.1 1.2  PROT 8.7* 7.1  ALBUMIN 3.3* 2.6*   No results for input(s): LIPASE, AMYLASE in the last 72 hours. CBC: Recent Labs    01/11/24 0834 01/11/24 1238 01/12/24 0442  WBC 9.9 9.5 7.5  NEUTROABS 7.9*  --   --   HGB 13.4 13.0 10.5*  HCT 41.0 39.1 31.3*  MCV 82.5 81.1 80.7  PLT 253 237 198   Cardiac Enzymes: Recent Labs    01/11/24 1118 01/11/24 1339 01/11/24 1613  TROPONINIHS 109* 200* 370*   BNP: Recent Labs    01/11/24 0834  BNP 530.2*   D-Dimer: No results for input(s): DDIMER in the last 72 hours. Hemoglobin A1C: No results for input(s): HGBA1C in the last 72 hours. Fasting Lipid Panel: No results for input(s): CHOL, HDL, LDLCALC, TRIG, CHOLHDL, LDLDIRECT in the last 72 hours. Thyroid Function Tests: No results for input(s): TSH, T4TOTAL, T3FREE,  THYROIDAB in the last 72 hours.  Invalid input(s): FREET3 Anemia Panel: No results for input(s): VITAMINB12, FOLATE, FERRITIN, TIBC, IRON , RETICCTPCT in the last 72 hours.   Radiology: [facility] Chest Port 1 View Result Date: 01/11/2024 CLINICAL DATA:  Shortness of breath. EXAM: PORTABLE CHEST 1 VIEW COMPARISON:  02/26/2019. FINDINGS: The heart size and mediastinal contours are within normal limits. Right mid and lower lung zone airspace opacity. Diffuse bilateral interstitial opacities. The left costophrenic angle is not included within the field of view. No sizable pleural effusion. No pneumothorax. No acute osseous abnormality. IMPRESSION: Diffuse bilateral interstitial opacities with dense right mid  and lower lung zone airspace disease, concerning for asymmetric pulmonary edema versus infection. Electronically Signed   By: Harrietta Sherry M.D.   On: 01/11/2024 09:07    ECHO ordered (scheduled for outpatient)  TELEMETRY reviewed by me 01/12/2024: sinus rhythm, rate 50s  EKG reviewed by me: sinus rhythm, rate 87 bpm with LVH (similar to prior EKGs)  Data reviewed by me 01/12/2024: last 24h vitals tele labs imaging I/O hospitalist progress notes.  Principal Problem:   CHF (congestive heart failure) (HCC) Active Problems:   Aortic stenosis   History of pulmonary embolism   Cardiomyopathy (HCC)    ASSESSMENT AND PLAN:  James Shepherd is a 63 y.o. male  with a past medical history of coronary artery disease (moderate 2 vessel disease per [facility] 2020), dilated cardiomyopathy, hypertension, hyperlipidemia, moderate aortic stenosis, hx bradycardia, hx PE (on Warfarin) who presented to the ED on 01/11/2024 for acute onset shortness of breath, pink frothy sputum and cough. Patient endorses worsening lower extremity swelling and orthopnea. Denies chest pain. Cardiology was consulted for further evaluation.   # Acute on chronic HFpEF # Hypertensive urgency with flash pulmonary edema # Acute  respiratory failure with hypoxia # Aortic Stenosis  BNP elevated at 530. CXR with pulmonary vascular congestion. -Echo ordered. Scheduled for outpatient on 08/19 at 12PM.  -Monitor and replenish electrolytes for a goal K >4, Mag >2  -Continue IV lasix  40 mg BID. Will d/c on PO lasix  40 mg in AM, 20 mg in PM (Patient reports 40 mg AM would stop working too early in the day and legs will swell in the afternoon)  (Pt states he can't take torsemide, causes bad cramps) -Continue dapagliflozin  10 mg daily for GDMT optimization. (Copay $ 12.15)Losartan  and coreg  as stated below.  # Coronary artery disease # Hypertension # Hyperlipidemia # Elevated troponin, likely demand ischemia due to above Patient without chest pain. Trops elevated and trending 68 > 109 > 200. EKG with sinus rhythm, rate 87 bpm with LVH (similar to prior EKGs) without acute ischemic changes. Of note, Lexiscan 08/16/23 showed moderate intensity partially reversible defect in basal to apical inferior wall suggestive of possible ischemia with prior infarction in RCA territory.  -Continue to trend troponins until peaked.  -Patient has documented allergy to ASA.  -Increased home amlodipine  to 10 mg daily. -Continue losartan  50 mg daily -Continue home Coreg  3.125 mg BID. Avoid uptitration due to hx bradycardia. -Continue pravastatin  40 mg daily.  -Abnormal stress test on 07/2023 would recommend outpatient [facility] if patient becomes agreeable. Currently, patient states he would like to avoid [facility] if possible bc he says he had a bad experience in 2020 from his last catheterization. Patient states he is will to discuss this at outpatient follow-up.   # Hx Pulmonary Embolism -Warfarin management per pharmacy.   Patient still appears volume up with mild bibasilar crackles and recommended patient to stay another day in hospital for continued diuresis.  However patient states he needs to go home today because he needs to feed his dogs.   Patient states he will leave AMA if he is not discharged today.  Due to this scheduled outpatient echocardiogram for 08/19 at 12 PM and scheduled heart failure clinic follow-up on 08/25 at 10 AM.  This patient's plan of care was discussed and created with Dr. Ammon and he is in agreement.  Signed: Dorene Comfort, PA-C  01/12/2024, 8:37 AM [facility]

## 2024-01-12 NOTE — Hospital Course (Addendum)
 James Shepherd is a pleasant 63 y.o. male with medical history of aortic stenosis, CAD, dilated cardiomyopathy, PE on Coumadin , HTN, bradycardia who presented to ED at [facility] with shortness of breath, acute onset started this morning around 5 AM.  EMS attempted CPAP but patient refused.  Denies any chest pain.  Per patient he gets these kind of shortness of breath every morning but then normally goes away in 15 to 20 minutes.  He presented to ED due to persistence of shortness of breath, also having some cough.  On arrival to ED patient was initially placed on CPAP followed by 4 L of oxygen via nasal cannula.  No baseline oxygen use.  Chest x-ray concerning for bilateral interstitial opacities consistent with pulmonary edema.  Labs with creatinine of 1.45, baseline around 1.2-1.3, troponin 109>> 370.  BNP 530,  Patient was given IV Lasix .  Echocardiogram ordered  8/14: Vital stable, now on room air.  No chest pain, labs seem stable and patient does not want to spend another night.  Discussed with cardiology they are arranging a close outpatient follow-up and echocardiogram. Received IV Lasix  and is being discharged on p.o Lasix .  Patient later left AMA before he was seen.  Troponin continued to increase, he also need cardiac catheterization and cardiology discussed with him, patient declined and would like to follow-up with his own cardiologist as outpatient.

## 2024-01-12 NOTE — ED Notes (Signed)
 Pt approached RN's station and stated that they are going to sign themselves out and leave and that they want their IV's out. Pt stated that they were told by Gabi this morning that they were going to get d/c'd and called for a ride and the ride is almost here. MD Caleen was made aware that pt is leaving.

## 2024-01-12 NOTE — ED Notes (Signed)
 Patient is pacing the ER room and hallways. Refuses to stay connected to heart monitor, pulse ox, and blood pressure cuff. Patient seen by cardiology provider and discussed possible discharge once they speak with admitting doctor. Patient is restless and reported he called a ride and leaving. Patient asking for discharge papers. MD Caleen Qualia made aware

## 2024-01-12 NOTE — Discharge Summary (Signed)
 Physician Discharge Summary   Patient: James Shepherd MRN: [id number] DOB: 1960/08/14  Admit date:     01/11/2024  Discharge date: 01/12/24  Discharge Physician: Amaryllis Dare   PCP: [place] HERO, MD   Recommendations at discharge:  Patient need to follow-up with cardiology and he left AMA  Discharge Diagnoses: Principal Problem:   CHF (congestive heart failure) (HCC) Active Problems:   Aortic stenosis   History of pulmonary embolism   Cardiomyopathy (HCC)  Resolved Problems:   * No resolved hospital problems. *  Hospital Course: [name] is a pleasant 63 y.o. male with medical history of aortic stenosis, CAD, dilated cardiomyopathy, PE on Coumadin , HTN, bradycardia who presented to ED at [facility] with shortness of breath, acute onset started this morning around 5 AM.  EMS attempted CPAP but patient refused.  Denies any chest pain.  Per patient he gets these kind of shortness of breath every morning but then normally goes away in 15 to 20 minutes.  He presented to ED due to persistence of shortness of breath, also having some cough.  On arrival to ED patient was initially placed on CPAP followed by 4 L of oxygen via nasal cannula.  No baseline oxygen use.  Chest x-ray concerning for bilateral interstitial opacities consistent with pulmonary edema.  Labs with creatinine of 1.45, baseline around 1.2-1.3, troponin 109>> 370.  BNP 530,  Patient was given IV Lasix .  Echocardiogram ordered  8/14: Vital stable, now on room air.  No chest pain, labs seem stable and patient does not want to spend another night.  Discussed with cardiology they are arranging a close outpatient follow-up and echocardiogram. Received IV Lasix  and is being discharged on p.o Lasix .  Patient later left AMA before he was seen.  Troponin continued to increase, he also need cardiac catheterization and cardiology discussed with him, patient declined and would like to follow-up with his own  cardiologist as outpatient.   Consultants: Cardiology Procedures performed: None Disposition: Home Diet recommendation:  Cardiac diet DISCHARGE MEDICATION:  Patient left AMA   Follow-up Information     [place], Caralyn, PA-C. Go in 1 week(s).   Specialty: Cardiology Why: HF 08/25 at 10 AM  ECHO: 08/19 at 12PM Contact information: [address] [place] [address] [phone]         Wilburn Keller BROCKS, MD. Go in 2 week(s).   Specialty: Cardiology Contact information: [address] [place] [address] [phone]                Discharge Exam: James Shepherd   01/11/24 9157  Weight: 77.1 kg   Patient was not seen by me  Condition at discharge: Patient left AMA  The results of significant diagnostics from this hospitalization (including imaging, microbiology, ancillary and laboratory) are listed below for reference.   Imaging Studies: DG Chest Port 1 View Result Date: 01/11/2024 CLINICAL DATA:  Shortness of breath. EXAM: PORTABLE CHEST 1 VIEW COMPARISON:  02/26/2019. FINDINGS: The heart size and mediastinal contours are within normal limits. Right mid and lower lung zone airspace opacity. Diffuse bilateral interstitial opacities. The left costophrenic angle is not included within the field of view. No sizable pleural effusion. No pneumothorax. No acute osseous abnormality. IMPRESSION: Diffuse bilateral interstitial opacities with dense right mid and lower lung zone airspace disease, concerning for asymmetric pulmonary edema versus infection. Electronically Signed   By: Harrietta Sherry M.D.   On: 01/11/2024 09:07    Microbiology: Results for orders  placed or performed during the hospital encounter of 01/11/24  SARS Coronavirus 2 by RT PCR (hospital order, performed in [facility] hospital lab) *cepheid single result test* Anterior Nasal Swab     Status: None   Collection Time: 01/11/24  8:50 AM   Specimen: Anterior Nasal Swab  Result Value  Ref Range Status   SARS Coronavirus 2 by RT PCR NEGATIVE NEGATIVE Final    Comment: (NOTE) SARS-CoV-2 target nucleic acids are NOT DETECTED.  The SARS-CoV-2 RNA is generally detectable in upper and lower respiratory specimens during the acute phase of infection. The lowest concentration of SARS-CoV-2 viral copies this assay can detect is 250 copies / mL. A negative result does not preclude SARS-CoV-2 infection and should not be used as the sole basis for treatment or other patient management decisions.  A negative result may occur with improper specimen collection / handling, submission of specimen other than nasopharyngeal swab, presence of viral mutation(s) within the areas targeted by this assay, and inadequate number of viral copies (<250 copies / mL). A negative result must be combined with clinical observations, patient history, and epidemiological information.  Fact Sheet for Patients:   RoadLapTop.co.za  Fact Sheet for Healthcare Providers: http://kim-miller.com/  This test is not yet approved or  cleared by the [place]  FDA and has been authorized for detection and/or diagnosis of SARS-CoV-2 by FDA under an Emergency Use Authorization (EUA).  This EUA will remain in effect (meaning this test can be used) for the duration of the COVID-19 declaration under Section 564(b)(1) of the Act, 21 U.S.C. section 360bbb-3(b)(1), unless the authorization is terminated or revoked sooner.  Performed at [facility], [address] [place], [place] [address]     Labs: CBC: Recent Labs  Lab 01/11/24 [phone] 01/11/24 1238 01/12/24 0442  WBC 9.9 9.5 7.5  NEUTROABS 7.9*  --   --   HGB 13.4 13.0 10.5*  HCT 41.0 39.1 31.3*  MCV 82.5 81.1 80.7  PLT 253 237 198   Basic Metabolic Panel: Recent Labs  Lab 01/11/24 0834 01/11/24 1238 01/12/24 0442  NA 134*  --  136  K 4.2  --  3.6  CL 102  --  106  CO2 25  --  24   GLUCOSE 145*  --  102*  BUN 25*  --  27*  CREATININE 1.45* 1.31* 1.42*  CALCIUM 9.1  --  8.1*   Liver Function Tests: Recent Labs  Lab 01/11/24 0834 01/12/24 0442  AST 56* 30  ALT 27 15  ALKPHOS 85 55  BILITOT 1.1 1.2  PROT 8.7* 7.1  ALBUMIN 3.3* 2.6*   CBG: No results for input(s): GLUCAP in the last 168 hours.   Signed: Amaryllis Dare, MD [facility] 01/12/2024

## 2024-01-12 NOTE — TOC CM/SW Note (Deleted)
..  Transition of Care [facility]) - Inpatient Brief Assessment   Patient Details  Name: James Shepherd MRN: [id number] Date of Birth: 1960/06/10  Transition of Care [facility]) CM/SW Contact:    Edsel DELENA Fischer, LCSW Phone Number: 01/12/2024, 11:06 AM   Clinical Narrative:   Transition of Care Asessment:
